# Patient Record
Sex: Female | Born: 1985 | Race: White | Hispanic: No | Marital: Married | Smoking: Never smoker
Health system: Southern US, Community
[De-identification: ages and names within clinical notes are randomized; demographics above are authoritative.]

## PROBLEM LIST (undated history)

## (undated) ENCOUNTER — Encounter

## (undated) ENCOUNTER — Encounter: Payer: BLUE CROSS/BLUE SHIELD | Attending: Colon & Rectal Surgery | Primary: Colon & Rectal Surgery

## (undated) ENCOUNTER — Telehealth

## (undated) ENCOUNTER — Encounter: Payer: BLUE CROSS/BLUE SHIELD | Attending: Surgery | Primary: Surgery

## (undated) ENCOUNTER — Encounter
Attending: Student in an Organized Health Care Education/Training Program | Primary: Student in an Organized Health Care Education/Training Program

## (undated) ENCOUNTER — Ambulatory Visit

## (undated) ENCOUNTER — Encounter: Payer: BLUE CROSS/BLUE SHIELD | Attending: Gastroenterology | Primary: Gastroenterology

## (undated) DIAGNOSIS — N809 Endometriosis, unspecified: Secondary | ICD-10-CM

## (undated) DIAGNOSIS — Z789 Other specified health status: Secondary | ICD-10-CM

## (undated) DIAGNOSIS — B019 Varicella without complication: Secondary | ICD-10-CM

## (undated) DIAGNOSIS — N764 Abscess of vulva: Secondary | ICD-10-CM

## (undated) DIAGNOSIS — L732 Hidradenitis suppurativa: Secondary | ICD-10-CM

## (undated) DIAGNOSIS — F331 Major depressive disorder, recurrent, moderate: Secondary | ICD-10-CM

## (undated) DIAGNOSIS — G4720 Circadian rhythm sleep disorder, unspecified type: Secondary | ICD-10-CM

## (undated) DIAGNOSIS — Z87442 Personal history of urinary calculi: Secondary | ICD-10-CM

## (undated) DIAGNOSIS — L910 Hypertrophic scar: Secondary | ICD-10-CM

## (undated) HISTORY — DX: Hypertrophic scar: L91.0

## (undated) HISTORY — DX: Varicella without complication: B01.9

## (undated) HISTORY — PX: DILATION AND CURETTAGE OF UTERUS: SHX78

## (undated) HISTORY — DX: Circadian rhythm sleep disorder, unspecified type: G47.20

## (undated) HISTORY — DX: Hidradenitis suppurativa: L73.2

## (undated) HISTORY — DX: Major depressive disorder, recurrent, moderate: F33.1

## (undated) HISTORY — DX: Endometriosis, unspecified: N80.9

## (undated) HISTORY — PX: TONSILLECTOMY: SUR1361

## (undated) HISTORY — PX: DIAGNOSTIC LAPAROSCOPY: SUR761

## (undated) HISTORY — DX: Abscess of vulva: N76.4

---
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
## 2011-10-10 DIAGNOSIS — N809 Endometriosis, unspecified: Secondary | ICD-10-CM

## 2011-10-10 HISTORY — DX: Endometriosis, unspecified: N80.9

## 2014-10-09 NOTE — L&D Delivery Note (Signed)
Delivery Note At 7:23 PM a viable female was delivered via Vaginal, Spontaneous Delivery (Presentation: LOT  ).  APGAR: 9, 0; weight 7 lb 5.5 oz (3330 g).   Placenta status: Intact, Spontaneous.     Anesthesia: Epidural  Episiotomy: None Lacerations: Periurethral;2nd degree Suture Repair: 2.0 3.0 vicryl Est. Blood Loss (mL):  100cc  Mom to postpartum.  Baby to Couplet care / Skin to Skin.  Tracie SimasJohanna K Johan [name] 07/30/2015, 10:00 PM

## 2015-02-15 LAB — OB RESULTS CONSOLE RPR: RPR: NONREACTIVE

## 2015-02-15 LAB — OB RESULTS CONSOLE ABO/RH: RH TYPE: POSITIVE

## 2015-02-15 LAB — OB RESULTS CONSOLE ANTIBODY SCREEN: Antibody Screen: NEGATIVE

## 2015-02-15 LAB — OB RESULTS CONSOLE GC/CHLAMYDIA
CHLAMYDIA, DNA PROBE: NEGATIVE
Gonorrhea: NEGATIVE

## 2015-02-15 LAB — OB RESULTS CONSOLE HEPATITIS B SURFACE ANTIGEN: Hepatitis B Surface Ag: NEGATIVE

## 2015-02-15 LAB — OB RESULTS CONSOLE RUBELLA ANTIBODY, IGM: Rubella: IMMUNE

## 2015-02-15 LAB — OB RESULTS CONSOLE HIV ANTIBODY (ROUTINE TESTING): HIV: NONREACTIVE

## 2015-06-15 DIAGNOSIS — Z3493 Encounter for supervision of normal pregnancy, unspecified, third trimester: Secondary | ICD-10-CM | POA: Insufficient documentation

## 2015-06-15 HISTORY — DX: Encounter for supervision of normal pregnancy, unspecified, third trimester: Z34.93

## 2015-07-12 DIAGNOSIS — Z6836 Body mass index (BMI) 36.0-36.9, adult: Secondary | ICD-10-CM | POA: Insufficient documentation

## 2015-07-12 DIAGNOSIS — E6609 Other obesity due to excess calories: Secondary | ICD-10-CM | POA: Insufficient documentation

## 2015-07-12 DIAGNOSIS — O99213 Obesity complicating pregnancy, third trimester: Secondary | ICD-10-CM | POA: Insufficient documentation

## 2015-07-12 HISTORY — DX: Obesity complicating pregnancy, third trimester: O99.213

## 2015-07-12 LAB — OB RESULTS CONSOLE GC/CHLAMYDIA
Chlamydia: NEGATIVE
GC PROBE AMP, GENITAL: NEGATIVE

## 2015-07-12 LAB — OB RESULTS CONSOLE GBS: GBS: NEGATIVE

## 2015-07-30 ENCOUNTER — Inpatient Hospital Stay: Admitting: Anesthesiology

## 2015-07-30 ENCOUNTER — Inpatient Hospital Stay
Admission: EM | Admit: 2015-07-30 | Discharge: 2015-08-01 | DRG: 775 | Disposition: A | Discharge status: Home / Self Care | Attending: Obstetrics and Gynecology | Admitting: Obstetrics and Gynecology

## 2015-07-30 DIAGNOSIS — Z3A38 38 weeks gestation of pregnancy: Secondary | ICD-10-CM

## 2015-07-30 LAB — ABO/RH: ABO/RH(D): A POS

## 2015-07-30 LAB — CBC
HCT: 40 % (ref 35.0–47.0)
HEMOGLOBIN: 13.6 g/dL (ref 12.0–16.0)
MCH: 29.9 pg (ref 26.0–34.0)
MCHC: 33.9 g/dL (ref 32.0–36.0)
MCV: 88.2 fL (ref 80.0–100.0)
Platelets: 162 10*3/uL (ref 150–440)
RBC: 4.54 MIL/uL (ref 3.80–5.20)
RDW: 14.2 % (ref 11.5–14.5)
WBC: 11.3 10*3/uL — ABNORMAL HIGH (ref 3.6–11.0)

## 2015-07-30 LAB — TYPE AND SCREEN
ABO/RH(D): A POS
Antibody Screen: NEGATIVE

## 2015-07-30 MED ORDER — LIDOCAINE HCL (PF) 1 % IJ SOLN: 30.0000 mL | INTRAMUSCULAR | Status: DC | PRN

## 2015-07-30 MED ORDER — LIDOCAINE-EPINEPHRINE (PF) 1.5 %-1:200000 IJ SOLN
INTRAMUSCULAR | Status: DC | PRN
  Administered 2015-07-30: 3 mL via EPIDURAL

## 2015-07-30 MED ORDER — CITRIC ACID-SODIUM CITRATE 334-500 MG/5ML PO SOLN
30.0000 mL | ORAL | Status: DC | PRN
Start: 2015-07-30 — End: 2015-07-30

## 2015-07-30 MED ORDER — BUPIVACAINE HCL (PF) 0.25 % IJ SOLN
INTRAMUSCULAR | Status: DC | PRN
  Administered 2015-07-30 (×2): 4 mL via EPIDURAL

## 2015-07-30 MED ORDER — BENZOCAINE-MENTHOL 20-0.5 % EX AERO
1.0000 "application " | INHALATION_SPRAY | CUTANEOUS | Status: DC | PRN
  Administered 2015-07-30: 1 via TOPICAL
  Filled 2015-07-30 (×3): qty 56

## 2015-07-30 MED ORDER — FENTANYL 2.5 MCG/ML W/ROPIVACAINE 0.2% IN NS 100 ML EPIDURAL INFUSION (ARMC-ANES)
EPIDURAL | Status: AC
  Filled 2015-07-30: qty 100

## 2015-07-30 MED ORDER — PRENATAL MULTIVITAMIN CH
1.0000 | ORAL_TABLET | Freq: Every day | ORAL | Status: DC
  Administered 2015-07-31 – 2015-08-01 (×2): 1 via ORAL
  Filled 2015-07-30 (×2): qty 1

## 2015-07-30 MED ORDER — OXYTOCIN BOLUS FROM INFUSION: 500.0000 mL | INTRAVENOUS | Status: DC

## 2015-07-30 MED ORDER — ONDANSETRON HCL 4 MG PO TABS
4.0000 mg | ORAL_TABLET | ORAL | Status: DC | PRN
  Administered 2015-07-31 – 2015-08-01 (×6): 4 mg via ORAL
  Filled 2015-07-30 (×6): qty 1

## 2015-07-30 MED ORDER — IBUPROFEN 600 MG PO TABS
600.0000 mg | ORAL_TABLET | Freq: Four times a day (QID) | ORAL | Status: DC
  Administered 2015-07-30 – 2015-08-01 (×8): 600 mg via ORAL
  Filled 2015-07-30 (×8): qty 1

## 2015-07-30 MED ORDER — SODIUM CHLORIDE 0.9 % IV SOLN: 250.0000 mL | INTRAVENOUS | Status: DC | PRN

## 2015-07-30 MED ORDER — LIDOCAINE HCL (PF) 1 % IJ SOLN
INTRAMUSCULAR | Status: AC
  Filled 2015-07-30: qty 30

## 2015-07-30 MED ORDER — MISOPROSTOL 200 MCG PO TABS
ORAL_TABLET | ORAL | Status: AC
  Filled 2015-07-30: qty 4

## 2015-07-30 MED ORDER — ONDANSETRON HCL 4 MG/2ML IJ SOLN: 4.0000 mg | Freq: Four times a day (QID) | INTRAMUSCULAR | Status: DC | PRN

## 2015-07-30 MED ORDER — OXYTOCIN 10 UNIT/ML IJ SOLN
INTRAMUSCULAR | Status: AC
  Filled 2015-07-30: qty 2

## 2015-07-30 MED ORDER — OXYCODONE-ACETAMINOPHEN 5-325 MG PO TABS
2.0000 | ORAL_TABLET | ORAL | Status: DC | PRN
  Administered 2015-07-30 – 2015-08-01 (×10): 2 via ORAL
  Filled 2015-07-30 (×10): qty 2

## 2015-07-30 MED ORDER — DIPHENHYDRAMINE HCL 25 MG PO CAPS: 25.0000 mg | ORAL_CAPSULE | Freq: Four times a day (QID) | ORAL | Status: DC | PRN

## 2015-07-30 MED ORDER — SODIUM CHLORIDE 0.9 % IJ SOLN: 3.0000 mL | INTRAMUSCULAR | Status: DC | PRN

## 2015-07-30 MED ORDER — LANOLIN HYDROUS EX OINT: TOPICAL_OINTMENT | CUTANEOUS | Status: DC | PRN

## 2015-07-30 MED ORDER — OXYCODONE-ACETAMINOPHEN 5-325 MG PO TABS: 1.0000 | ORAL_TABLET | ORAL | Status: DC | PRN

## 2015-07-30 MED ORDER — ONDANSETRON HCL 4 MG/2ML IJ SOLN
4.0000 mg | INTRAMUSCULAR | Status: DC | PRN
  Administered 2015-07-30 – 2015-07-31 (×3): 4 mg via INTRAVENOUS
  Filled 2015-07-30 (×3): qty 2

## 2015-07-30 MED ORDER — AMMONIA AROMATIC IN INHA
RESPIRATORY_TRACT | Status: AC
  Filled 2015-07-30: qty 10

## 2015-07-30 MED ORDER — LACTATED RINGERS IV SOLN
INTRAVENOUS | Status: DC
Start: 2015-07-30 — End: 2015-07-30
  Administered 2015-07-30 (×2): via INTRAVENOUS

## 2015-07-30 MED ORDER — SODIUM CHLORIDE 0.9 % IJ SOLN: 3.0000 mL | Freq: Two times a day (BID) | INTRAMUSCULAR | Status: DC

## 2015-07-30 MED ORDER — BISACODYL 10 MG RE SUPP: 10.0000 mg | Freq: Every day | RECTAL | Status: DC | PRN

## 2015-07-30 MED ORDER — TETANUS-DIPHTH-ACELL PERTUSSIS 5-2.5-18.5 LF-MCG/0.5 IM SUSP: 0.5000 mL | Freq: Once | INTRAMUSCULAR | Status: DC

## 2015-07-30 MED ORDER — BUTORPHANOL TARTRATE 1 MG/ML IJ SOLN
INTRAMUSCULAR | Status: AC
  Administered 2015-07-30: 2 mg via INTRAVENOUS
  Filled 2015-07-30: qty 2

## 2015-07-30 MED ORDER — OXYTOCIN 40 UNITS IN LACTATED RINGERS INFUSION - SIMPLE MED: 62.5000 mL/h | INTRAVENOUS | Status: DC

## 2015-07-30 MED ORDER — FLEET ENEMA 7-19 GM/118ML RE ENEM: 1.0000 | ENEMA | Freq: Every day | RECTAL | Status: DC | PRN

## 2015-07-30 MED ORDER — WITCH HAZEL-GLYCERIN EX PADS
1.0000 "application " | MEDICATED_PAD | CUTANEOUS | Status: DC | PRN
  Administered 2015-07-30: 1 via TOPICAL
  Filled 2015-07-30: qty 100

## 2015-07-30 MED ORDER — ACETAMINOPHEN 325 MG PO TABS: 650.0000 mg | ORAL_TABLET | ORAL | Status: DC | PRN

## 2015-07-30 MED ORDER — LACTATED RINGERS IV SOLN: 500.0000 mL | INTRAVENOUS | Status: DC | PRN

## 2015-07-30 MED ORDER — DIBUCAINE 1 % RE OINT
1.0000 "application " | TOPICAL_OINTMENT | RECTAL | Status: DC | PRN
  Administered 2015-07-30: 1 via RECTAL
  Filled 2015-07-30: qty 28

## 2015-07-30 MED ORDER — BUTORPHANOL TARTRATE 1 MG/ML IJ SOLN
2.0000 mg | Freq: Once | INTRAMUSCULAR | Status: AC
  Administered 2015-07-30: 2 mg via INTRAVENOUS

## 2015-07-30 MED ORDER — SENNOSIDES-DOCUSATE SODIUM 8.6-50 MG PO TABS
2.0000 | ORAL_TABLET | ORAL | Status: DC
  Filled 2015-07-30: qty 2

## 2015-07-30 MED ORDER — FENTANYL 2.5 MCG/ML W/ROPIVACAINE 0.2% IN NS 100 ML EPIDURAL INFUSION (ARMC-ANES)
EPIDURAL | Status: DC | PRN
  Administered 2015-07-30: 10 mL/h via EPIDURAL

## 2015-07-30 MED ORDER — ZOLPIDEM TARTRATE 5 MG PO TABS: 5.0000 mg | ORAL_TABLET | Freq: Every evening | ORAL | Status: DC | PRN

## 2015-07-30 MED ORDER — OXYCODONE-ACETAMINOPHEN 5-325 MG PO TABS: 2.0000 | ORAL_TABLET | ORAL | Status: DC | PRN

## 2015-07-30 MED ORDER — LACTATED RINGERS IV SOLN: INTRAVENOUS | Status: DC

## 2015-07-30 MED ORDER — OXYTOCIN 40 UNITS IN LACTATED RINGERS INFUSION - SIMPLE MED
62.5000 mL/h | INTRAVENOUS | Status: DC | PRN
  Administered 2015-07-31: 62.5 mL/h via INTRAVENOUS
  Filled 2015-07-30: qty 1000

## 2015-07-30 MED ORDER — SIMETHICONE 80 MG PO CHEW: 80.0000 mg | CHEWABLE_TABLET | ORAL | Status: DC | PRN

## 2015-07-30 NOTE — Anesthesia Preprocedure Evaluation (Signed)
Anesthesia Evaluation  Patient identified by MRN, date of birth, ID band Patient awake    Reviewed: Allergy & Precautions, H&P , NPO status , Patient's Chart, lab work & pertinent test results  History of Anesthesia Complications Negative for: history of anesthetic complications  Airway Mallampati: II       Dental no notable dental hx.    Pulmonary    Pulmonary exam normal        Cardiovascular Normal cardiovascular exam     Neuro/Psych    GI/Hepatic   Endo/Other    Renal/GU      Musculoskeletal   Abdominal   Peds  Hematology   Anesthesia Other Findings   Reproductive/Obstetrics (+) Pregnancy                             Anesthesia Physical Anesthesia Plan  ASA: II  Anesthesia Plan: Epidural   Post-op Pain Management:    Induction:   Airway Management Planned:   Additional Equipment:   Intra-op Plan:   Post-operative Plan:   Informed Consent: I have reviewed the patients History and Physical, chart, labs and discussed the procedure including the risks, benefits and alternatives for the proposed anesthesia with the patient or authorized representative who has indicated his/her understanding and acceptance.     Plan Discussed with: Anesthesiologist  Anesthesia Plan Comments:         Anesthesia Quick Evaluation

## 2015-07-30 NOTE — H&P (Signed)
OB ATTENDING NOTE  LMP of 11/05/14 EDD: 08/12/15  Subjective:   29 y.o. G3P1011 at 38+1 here in labor.  APC: [place]   MBT  A+  Ab screen  Neg Pap  HIV  Hep B/RPR negative/negative  Rubella immune   VZV   Aneuploidy:   First trimester (Informaseq, NT):   Second trimester (AFP/tetra):   28 weeks:   Blood consent:  Hgb:  11.7  Glucola: 143, 3 hr GTT was normal (72,536,644,03(85,143,118,97)  Rhogam: not needed  36 weeks:   GBS: Negative   G/C: Neg/Neg  Hgb: 12.7  Last US:   03/22/15-post plac, vertex, normal anatomy  Immunization:    Flu in season - 07/12/15  Tdap at 27-36 weeks - 07/12/15  Blood consent signed  Previous NSVD 04/08/2012 2.75kg MC  Objective:   Vital signs in last 24 hours: Temp:  [98.8 F (37.1 C)] 98.8 F (37.1 C) (10/21 1034) Pulse Rate:  [78-126] 78 (10/21 1726) BP: (97-137)/(53-108) 116/65 mmHg (10/21 1726) SpO2:  [94 %] 94 % (10/21 1706) Weight:  [94.802 kg (209 lb)] 94.802 kg (209 lb) (10/21 1034)   General:   alert, cooperative and appears stated age  Abdomen:  soft, non-tender; bowel sounds normal; no masses,  no organomegaly  Pelvis:  Exam deferred. Vulva normal   Started at 1/long/high this morning, quickly transitioned to 3cm then 8cm SVE: 8/80/-2, AROM'd clear fluid    Assessment/Plan:  .   29 y.o. G3P1011 at 38+1 here in active labor.   Risks, benefits, alternatives and possible complications have been discussed in detail with the patient.  Pre-admission, admission, and post admission procedures and expectations were discussed in detail.  All questions answered, all appropriate consents will be signed at the Hospital. Admission is planned for today.  Anticipate vaginal delivery.    Anticipate NSVD  Ala DachJohanna K Samyrah Bruster, MD

## 2015-07-30 NOTE — OB Triage Note (Signed)
Pt place on LDR5 for obs for contractions. Oriented to room, POC discussed and verbalized understanding of plan.

## 2015-07-30 NOTE — Anesthesia Procedure Notes (Signed)
Epidural  Start time: 07/30/2015 4:40 PM End time: 07/30/2015 4:53 PM  Staffing Resident/CRNA: Stormy FabianURTIS, LINDA Performed by: resident/CRNA   Preanesthetic Checklist Completed: patient identified, site marked, surgical consent, pre-op evaluation, IV checked, risks and benefits discussed and monitors and equipment checked  Epidural Patient position: sitting Prep: Betadine Patient monitoring: heart rate, continuous pulse ox and blood pressure Approach: midline Location: L3-L4 Injection technique: LOR saline  Needle:  Needle type: Tuohy  Needle gauge: 17 G Needle length: 9 cm Needle insertion depth: 5.5 cm Catheter type: closed end flexible Catheter size: 20 Guage Catheter at skin depth: 11 cm Test dose: negative and 1.5% lidocaine with Epi 1:200 K  Assessment Events: blood not aspirated, injection not painful, no injection resistance, negative IV test and no paresthesia  Additional Notes Reason for block:procedure for pain

## 2015-07-31 LAB — CBC
HCT: 34.9 % — ABNORMAL LOW (ref 35.0–47.0)
HEMOGLOBIN: 11.9 g/dL — AB (ref 12.0–16.0)
MCH: 30.3 pg (ref 26.0–34.0)
MCHC: 34.2 g/dL (ref 32.0–36.0)
MCV: 88.6 fL (ref 80.0–100.0)
Platelets: 151 10*3/uL (ref 150–440)
RBC: 3.94 MIL/uL (ref 3.80–5.20)
RDW: 14.5 % (ref 11.5–14.5)
WBC: 11.6 10*3/uL — AB (ref 3.6–11.0)

## 2015-07-31 LAB — RPR: RPR Ser Ql: NONREACTIVE

## 2015-07-31 NOTE — Progress Notes (Signed)
OB ATTENDING NOTE  PPD#1  29 y.o. [id number] s/p NSVD @ 38+1 c/b R periurethral and 2nd degree tear. Repaired with 3-0 and 2-0 vicryl. Doing well. Bleeding controlled. Pain controlled with percocet. Ambulating to bathroom. Passing gas. Eating well. Hydrating. No ha/blurry vision. Breast feeding without difficulty  O: Filed Vitals:   07/30/15 2155 07/31/15 0631 07/31/15 0822 07/31/15 1204  BP: 123/57 113/63 110/62 113/69  Pulse: 112 82 80 74  Temp: 98.3 F (36.8 C) 98.1 F (36.7 C) 98.1 F (36.7 C) 98 F (36.7 C)  TempSrc: Oral Axillary Axillary Oral  Resp: 20 18 18 18   Height:      Weight:      SpO2: 96% 97% 96%     GEN: NAD ABD: fundus firm below umbilicus VULVA: healing well  CBC Latest Ref Rng 07/31/2015 07/30/2015  WBC 3.6 - 11.0 K/uL 11.6(H) 11.3(H)  Hemoglobin 12.0 - 16.0 g/dL 11.9(L) 13.6  Hematocrit 35.0 - 47.0 % 34.9(L) 40.0  Platelets 150 - 440 K/uL 151 162   A/P: PPD#1 Pain control with percocet and motrin Colace Cont to ambulate Ok to remove IV Stable h/h Stable vitals  Still considering contraception Await VZV results  Will f/u tomorrow Ala DachJohanna K Lynia Landry, MD

## 2015-07-31 NOTE — Progress Notes (Signed)
Called to evaluate patient as lochia remained moderate per RN. Perineum checked. Minimal swelling. With gentle pressure some dark red blood expressed. Not active. No bright red blood. Incision site intact and not bleeding.   Will cont to monitor. F/u cbc in AM.

## 2015-08-01 LAB — VARICELLA ZOSTER ANTIBODY, IGG: VARICELLA IGG: 1034 {index} (ref >165)

## 2015-08-01 MED ORDER — PRENATAL MULTIVITAMIN CH: 1.0000 | ORAL_TABLET | Freq: Every day | ORAL | Status: DC

## 2015-08-01 MED ORDER — ACETAMINOPHEN-CODEINE #3 300-30 MG PO TABS: 2.0000 | ORAL_TABLET | Freq: Four times a day (QID) | ORAL | Status: DC | PRN

## 2015-08-01 MED ORDER — WHITE PETROLATUM GEL
Status: AC
  Filled 2015-08-01: qty 5

## 2015-08-01 NOTE — Anesthesia Postprocedure Evaluation (Signed)
  Anesthesia Post-op Note  Patient: Tracie [name]  Procedure(s) Performed: * No procedures listed *  Anesthesia type:Epidural  Patient location: PACU  Post pain: Pain level controlled  Post assessment: Post-op Vital signs reviewed, Patient's Cardiovascular Status Stable, Respiratory Function Stable, Patent Airway and No signs of Nausea or vomiting  Post vital signs: Reviewed and stable  Last Vitals:  Filed Vitals:   08/01/15 1614  BP:   Pulse:   Temp: 36.9 C  Resp:     Level of consciousness: awake, alert  and patient cooperative  Complications: No apparent anesthesia complications

## 2015-08-01 NOTE — Progress Notes (Signed)
Discharge instructions reviewed with patient. Patient discharged home.

## 2015-08-01 NOTE — Discharge Instructions (Signed)

## 2015-08-01 NOTE — Discharge Summary (Addendum)
Obstetric Discharge Summary Reason for Admission: onset of labor Prenatal Procedures: none Intrapartum Procedures: spontaneous vaginal delivery Postpartum Procedures: none Complications-Operative and Postpartum: 2nd degree perineal laceration repaired with 2-0 vicryl , right periurethral repaired with 3-0 HEMOGLOBIN  Date Value Ref Range Status  07/31/2015 11.9* 12.0 - 16.0 g/dL Final   HCT  Date Value Ref Range Status  07/31/2015 34.9* 35.0 - 47.0 % Final    Physical Exam:  General: alert, cooperative, appears stated age and no distress Lochia: appropriate Uterine Fundus: firm Incision: healing well DVT Evaluation: No evidence of DVT seen on physical exam.  Discharge Diagnoses: Term Pregnancy-delivered  Discharge Information: Date: 08/01/2015 Activity: pelvic rest Diet: routine Medications: PNV, Tylenol #3, Ibuprofen and Colace Condition: stable Instructions: refer to practice specific booklet Discharge to: home   Contraception: Mirena IUD *[facility]  Newborn Data: Live born female  Birth Weight: 7 lb 5.5 oz (3330 g) APGAR: 9,   Home with mother.  Tracie [name] 08/01/2015, 9:32 AM

## 2015-09-08 DIAGNOSIS — L732 Hidradenitis suppurativa: Secondary | ICD-10-CM

## 2015-09-08 HISTORY — DX: Hidradenitis suppurativa: L73.2

## 2015-12-29 DIAGNOSIS — N764 Abscess of vulva: Secondary | ICD-10-CM | POA: Insufficient documentation

## 2015-12-29 HISTORY — DX: Abscess of vulva: N76.4

## 2016-01-24 ENCOUNTER — Encounter
Admission: RE | Admit: 2016-01-24 | Discharge: 2016-01-24 | Disposition: A | Discharge status: Home / Self Care | Payer: 59 | Source: Ambulatory Visit | Attending: Obstetrics and Gynecology | Admitting: Obstetrics and Gynecology

## 2016-01-24 DIAGNOSIS — L732 Hidradenitis suppurativa: Secondary | ICD-10-CM | POA: Diagnosis not present

## 2016-01-24 DIAGNOSIS — Z01812 Encounter for preprocedural laboratory examination: Secondary | ICD-10-CM | POA: Diagnosis present

## 2016-01-24 HISTORY — DX: Other specified health status: Z78.9

## 2016-01-24 LAB — BASIC METABOLIC PANEL
ANION GAP: 4 — AB (ref 5–15)
BUN: 11 mg/dL (ref 6–20)
CHLORIDE: 105 mmol/L (ref 101–111)
CO2: 27 mmol/L (ref 22–32)
Calcium: 9.2 mg/dL (ref 8.9–10.3)
Creatinine, Ser: 0.59 mg/dL (ref 0.44–1.00)
GFR calc Af Amer: >60 mL/min (ref >60)
GLUCOSE: 87 mg/dL (ref 65–99)
POTASSIUM: 3.8 mmol/L (ref 3.5–5.1)
Sodium: 136 mmol/L (ref 135–145)

## 2016-01-24 LAB — CBC
HEMATOCRIT: 40 % (ref 35.0–47.0)
HEMOGLOBIN: 13.6 g/dL (ref 12.0–16.0)
MCH: 29.1 pg (ref 26.0–34.0)
MCHC: 34 g/dL (ref 32.0–36.0)
MCV: 85.8 fL (ref 80.0–100.0)
Platelets: 236 10*3/uL (ref 150–440)
RBC: 4.67 MIL/uL (ref 3.80–5.20)
RDW: 13 % (ref 11.5–14.5)
WBC: 5.9 10*3/uL (ref 3.6–11.0)

## 2016-01-24 LAB — TYPE AND SCREEN
ABO/RH(D): A POS
ANTIBODY SCREEN: NEGATIVE

## 2016-01-24 NOTE — Pre-Procedure Instructions (Signed)
Patient instructed on Incentive Spirometry use. Patient verbalized understanding

## 2016-01-24 NOTE — H&P (Signed)
Tracie [name] is a 30 y.o. female here for excisional biopsy for hidradenitis suppurativa. Returns for f/up for chronic recurrent left vulvar abscess formation . Pt on BActrim ds with healing . This has been a recurrent issue for 2 yrs.   Past Medical History:  has a past medical history of Endometriosis of uterus.  Past Surgical History:  has a past surgical history that includes Pelvic laparoscopy (2010) and Dilation and curettage of uterus (2011). Family History: family history includes Breast cancer in her maternal grandmother; Colon cancer in her paternal grandfather. Social History:  reports that she has never smoked. She does not have any smokeless tobacco history on file. She reports that she does not drink alcohol or use illicit drugs. OB/GYN History:  OB History    Gravida Para Term Preterm AB TAB SAB Ectopic Multiple Living   3 2 2  1  1   2       Allergies: is allergic to norco [hydrocodone-acetaminophen]. Medications:  Current Outpatient Prescriptions:  . cephalexin (KEFLEX) 500 MG capsule, Take 1 capsule (500 mg total) by mouth 2 (two) times daily. (Patient not taking: Reported on 12/20/2015 ), Disp: 14 capsule, Rfl: 0 . clindamycin (CLEOCIN T) 1 % topical solution, Apply topically 2 (two) times daily. Apply to affected twice a day to affected area for 3 months (Patient not taking: Reported on 12/20/2015 ), Disp: 60 mL, Rfl: 2 . ondansetron (ZOFRAN) 4 MG tablet, Take 1 tablet (4 mg total) by mouth every 8 (eight) hours as needed for Nausea., Disp: 20 tablet, Rfl: 0 . oxyCODONE-acetaminophen (PERCOCET) 5-325 mg tablet, Take 1 tablet by mouth every 6 (six) hours as needed for Pain., Disp: 30 tablet, Rfl: 0 . prenatal vit-iron fumarate-FA (PRENAVITE) tablet, Take 1 tablet by mouth once daily. Reported on 12/20/2015 , Disp: , Rfl:  . sulfamethoxazole-trimethoprim (BACTRIM DS) 800-160 mg tablet, Take 1 tablet (160 mg of trimethoprim total) by mouth 2 (two) times daily., Disp: 20  tablet, Rfl: 0  Review of Systems: General:   No fatigue or weight loss Eyes:   No vision changes Ears:   No hearing difficulty Respiratory:   No cough or shortness of breath Pulmonary:   No asthma or shortness of breath Cardiovascular:  No chest pain, palpitations, dyspnea on exertion Gastrointestinal:  No abdominal bloating, chronic diarrhea, constipations, masses, pain or hematochezia Genitourinary:  No hematuria, dysuria, abnormal vaginal discharge, pelvic pain, Menometrorrhagia Lymphatic:  No swollen lymph nodes Musculoskeletal: No muscle weakness Neurologic:  No extremity weakness, syncope, seizure disorder Psychiatric:  No history of depression, delusions or suicidal/homicidal ideation   Exam:      Vitals:   12/29/15 1610  BP: 121/72  Pulse: 88    Body mass index is 35.07 kg/(m^2).  WDWN Wisener/ black female in NAD  Lungs: CTA  CV : RRR without murmur  Breast: exam done in sitting and lying position : No dimpling or retraction, no dominant mass, no spontaneous discharge, no axillary adenopathy Neck: no thyromegaly Abdomen: soft , no mass, normal active bowel sounds, non-tender, no rebound tenderness Pelvic: tanner stage 5 ,  External genitalia: left vulvar induration . No fluctuance Urethra: no prolapse   Impression:   The primary encounter diagnosis was Vulval hidradenitis suppurativa. A diagnosis of Vulvar abscess was also pertinent to this visit. Not responding to conservative measures    Plan:   Recommend vulvar excisional biopsy Procedure discuss with the pt     Return if symptoms worsen or fail to improve, for preop.  Vilma Prader, MD       Electronically signed by Vilma Prader, MD at 12/29/2015 4:59 PM      Office Visit on 12/29/2015      Department  Name Address Phone Fax  [facility] [address] [place] [address] [phone]  Service Location  Name  Address      [facility] MEDICINE SERVICE AREA [address] [place] [address]

## 2016-01-24 NOTE — Patient Instructions (Addendum)
Your procedure is scheduled on: Monday 01/31/16 Report to Day Surgery. 2ND FLOOR MEDICAL MALL ENTRANCE To find out your arrival time please call [phone] between 1PM - 3PM on Friday 01/28/16.  Remember: Instructions that are not followed completely may result in serious medical risk, up to and including death, or upon the discretion of your surgeon and anesthesiologist your surgery may need to be rescheduled.    __X__ 1. Do not eat food or drink liquids after midnight. No gum chewing or hard candies.     __X__ 2. No Alcohol for 24 hours before or after surgery.   ____ 3. Bring all medications with you on the day of surgery if instructed.    __X__ 4. Notify your doctor if there is any change in your medical condition     (cold, fever, infections).     Do not wear jewelry, make-up, hairpins, clips or nail polish.  Do not wear lotions, powders, or perfumes.   Do not shave 48 hours prior to surgery. Men may shave face and neck.  Do not bring valuables to the hospital.    [facility] any belongings or valuables.               Contacts, dentures or bridgework may not be worn into surgery.  Leave your suitcase in the car. After surgery it may be brought to your room.  For patients admitted to the hospital, discharge time is determined by your                treatment team.   Patients discharged the day of surgery will not be allowed to drive home.   Please read over the following fact sheets that you were given:   Surgical Site Infection Prevention   ____ Take these medicines the morning of surgery with A SIP OF WATER:    1. NONE  2. BRING INCENTIVE SPIROMETER  3.   4.  5.  6.  ____ Fleet Enema (as directed)   __X__ Use CHG Soap as directed  ____ Use inhalers on the day of surgery  ____ Stop metformin 2 days prior to surgery    ____ Take 1/2 of usual insulin dose the night before surgery and none on the morning of surgery.   ____ Stop  Coumadin/Plavix/aspirin on   ____ Stop Anti-inflammatories on    ____ Stop supplements until after surgery.    ____ Bring C-Pap to the hospital.

## 2016-01-31 ENCOUNTER — Ambulatory Visit: Payer: 59 | Admitting: Certified Registered Nurse Anesthetist

## 2016-01-31 ENCOUNTER — Encounter
Admission: RE | Disposition: A | Discharge status: Home / Self Care | Payer: Self-pay | Source: Ambulatory Visit | Attending: Obstetrics and Gynecology

## 2016-01-31 ENCOUNTER — Encounter: Payer: Self-pay | Admitting: *Deleted

## 2016-01-31 ENCOUNTER — Ambulatory Visit
Admission: RE | Admit: 2016-01-31 | Discharge: 2016-01-31 | Disposition: A | Discharge status: Home / Self Care | Payer: 59 | Source: Ambulatory Visit | Attending: Obstetrics and Gynecology | Admitting: Obstetrics and Gynecology

## 2016-01-31 DIAGNOSIS — Z885 Allergy status to narcotic agent status: Secondary | ICD-10-CM | POA: Insufficient documentation

## 2016-01-31 DIAGNOSIS — Z8742 Personal history of other diseases of the female genital tract: Secondary | ICD-10-CM | POA: Insufficient documentation

## 2016-01-31 DIAGNOSIS — Z79899 Other long term (current) drug therapy: Secondary | ICD-10-CM | POA: Diagnosis not present

## 2016-01-31 DIAGNOSIS — L732 Hidradenitis suppurativa: Secondary | ICD-10-CM | POA: Diagnosis present

## 2016-01-31 DIAGNOSIS — N764 Abscess of vulva: Secondary | ICD-10-CM | POA: Insufficient documentation

## 2016-01-31 HISTORY — PX: VULVA /PERINEUM BIOPSY: SHX319

## 2016-01-31 LAB — POCT PREGNANCY, URINE: PREG TEST UR: NEGATIVE

## 2016-01-31 SURGERY — BIOPSY, VULVA
Anesthesia: General | Laterality: Left | Wound class: Dirty or Infected

## 2016-01-31 MED ORDER — SILVER NITRATE-POT NITRATE 75-25 % EX MISC
CUTANEOUS | Status: AC
  Filled 2016-01-31: qty 4

## 2016-01-31 MED ORDER — FAMOTIDINE 20 MG PO TABS
20.0000 mg | ORAL_TABLET | Freq: Once | ORAL | Status: AC
  Administered 2016-01-31: 20 mg via ORAL

## 2016-01-31 MED ORDER — CEFAZOLIN SODIUM 1-5 GM-% IV SOLN
1.0000 g | Freq: Three times a day (TID) | INTRAVENOUS | Status: DC
  Administered 2016-01-31: 1 g via INTRAVENOUS

## 2016-01-31 MED ORDER — ONDANSETRON HCL 4 MG/2ML IJ SOLN: 4.0000 mg | Freq: Once | INTRAMUSCULAR | Status: DC | PRN

## 2016-01-31 MED ORDER — BACITRACIN ZINC 500 UNIT/GM EX OINT
TOPICAL_OINTMENT | CUTANEOUS | Status: DC | PRN
  Administered 2016-01-31: 1 via TOPICAL

## 2016-01-31 MED ORDER — KETOROLAC TROMETHAMINE 30 MG/ML IJ SOLN
INTRAMUSCULAR | Status: DC | PRN
  Administered 2016-01-31: 30 mg via INTRAVENOUS

## 2016-01-31 MED ORDER — ACETAMINOPHEN 10 MG/ML IV SOLN
INTRAVENOUS | Status: DC | PRN
  Administered 2016-01-31: 1000 mg via INTRAVENOUS

## 2016-01-31 MED ORDER — DEXAMETHASONE SODIUM PHOSPHATE 10 MG/ML IJ SOLN
INTRAMUSCULAR | Status: DC | PRN
  Administered 2016-01-31: 10 mg via INTRAVENOUS

## 2016-01-31 MED ORDER — LIDOCAINE-EPINEPHRINE (PF) 1 %-1:200000 IJ SOLN
INTRAMUSCULAR | Status: AC
  Filled 2016-01-31: qty 30

## 2016-01-31 MED ORDER — BUPIVACAINE-EPINEPHRINE (PF) 0.5% -1:200000 IJ SOLN
INTRAMUSCULAR | Status: AC
  Filled 2016-01-31: qty 30

## 2016-01-31 MED ORDER — ACETAMINOPHEN 10 MG/ML IV SOLN
INTRAVENOUS | Status: AC
  Filled 2016-01-31: qty 100

## 2016-01-31 MED ORDER — FAMOTIDINE 20 MG PO TABS
ORAL_TABLET | ORAL | Status: AC
  Filled 2016-01-31: qty 1

## 2016-01-31 MED ORDER — BACITRACIN ZINC 500 UNIT/GM EX OINT
TOPICAL_OINTMENT | CUTANEOUS | Status: AC
  Filled 2016-01-31: qty 0.9

## 2016-01-31 MED ORDER — BUPIVACAINE-EPINEPHRINE (PF) 0.5% -1:200000 IJ SOLN
INTRAMUSCULAR | Status: DC | PRN
  Administered 2016-01-31: 15 mL

## 2016-01-31 MED ORDER — MIDAZOLAM HCL 2 MG/2ML IJ SOLN
INTRAMUSCULAR | Status: DC | PRN
  Administered 2016-01-31: 2 mg via INTRAVENOUS

## 2016-01-31 MED ORDER — CEFAZOLIN SODIUM 1-5 GM-% IV SOLN
INTRAVENOUS | Status: AC
  Filled 2016-01-31: qty 50

## 2016-01-31 MED ORDER — LIDOCAINE HCL (CARDIAC) 20 MG/ML IV SOLN
INTRAVENOUS | Status: DC | PRN
  Administered 2016-01-31: 100 mg via INTRAVENOUS

## 2016-01-31 MED ORDER — PROPOFOL 10 MG/ML IV BOLUS
INTRAVENOUS | Status: DC | PRN
  Administered 2016-01-31: 200 mg via INTRAVENOUS

## 2016-01-31 MED ORDER — LACTATED RINGERS IV SOLN: INTRAVENOUS | Status: DC

## 2016-01-31 MED ORDER — FENTANYL CITRATE (PF) 100 MCG/2ML IJ SOLN
INTRAMUSCULAR | Status: DC | PRN
  Administered 2016-01-31 (×4): 25 ug via INTRAVENOUS
  Administered 2016-01-31: 50 ug via INTRAVENOUS
  Administered 2016-01-31: 25 ug via INTRAVENOUS

## 2016-01-31 MED ORDER — LACTATED RINGERS IV SOLN
INTRAVENOUS | Status: DC
  Administered 2016-01-31: 11:00:00 via INTRAVENOUS

## 2016-01-31 MED ORDER — FENTANYL CITRATE (PF) 100 MCG/2ML IJ SOLN: 25.0000 ug | INTRAMUSCULAR | Status: DC | PRN

## 2016-01-31 MED ORDER — ONDANSETRON HCL 4 MG/2ML IJ SOLN
INTRAMUSCULAR | Status: DC | PRN
  Administered 2016-01-31: 4 mg via INTRAVENOUS

## 2016-01-31 SURGICAL SUPPLY — 32 items
BLADE SURG 15 STRL LF DISP TIS (BLADE) ×1 IMPLANT
BLADE SURG 15 STRL SS (BLADE) ×2
CANISTER SUCT 1200ML W/VALVE (MISCELLANEOUS) ×3 IMPLANT
CATH ROBINSON RED A/P 16FR (CATHETERS) ×3 IMPLANT
DRAPE PERI LITHO V/GYN (MISCELLANEOUS) ×3 IMPLANT
DRAPE UNDER BUTTOCK W/FLU (DRAPES) ×3 IMPLANT
DRSG TEGADERM 2-3/8X2-3/4 SM (GAUZE/BANDAGES/DRESSINGS) ×6 IMPLANT
DRSG TEGADERM 4X4.75 (GAUZE/BANDAGES/DRESSINGS) ×3 IMPLANT
DRSG TELFA 3X8 NADH (GAUZE/BANDAGES/DRESSINGS) ×3 IMPLANT
GAUZE SPONGE NON-WVN 2X2 STRL (MISCELLANEOUS) ×2 IMPLANT
GLOVE BIO SURGEON STRL SZ8 (GLOVE) ×9 IMPLANT
GOWN STRL REUS W/ TWL LRG LVL3 (GOWN DISPOSABLE) ×3 IMPLANT
GOWN STRL REUS W/ TWL XL LVL3 (GOWN DISPOSABLE) ×2 IMPLANT
GOWN STRL REUS W/TWL LRG LVL3 (GOWN DISPOSABLE) ×6
GOWN STRL REUS W/TWL XL LVL3 (GOWN DISPOSABLE) ×4
KIT RM TURNOVER CYSTO AR (KITS) ×3 IMPLANT
NDL SAFETY 22GX1.5 (NEEDLE) ×3 IMPLANT
NS IRRIG 500ML POUR BTL (IV SOLUTION) ×3 IMPLANT
PACK BASIN MINOR ARMC (MISCELLANEOUS) ×3 IMPLANT
PAD OB MATERNITY 4.3X12.25 (PERSONAL CARE ITEMS) ×3 IMPLANT
PAD PREP 24X41 OB/GYN DISP (PERSONAL CARE ITEMS) ×3 IMPLANT
SOL PREP PVP 2OZ (MISCELLANEOUS) ×3
SOLUTION PREP PVP 2OZ (MISCELLANEOUS) ×1 IMPLANT
SPONGE VERSALON 2X2 STRL (MISCELLANEOUS) ×4
SPONGE XRAY 4X4 16PLY STRL (MISCELLANEOUS) ×3 IMPLANT
SUT CHROMIC 2 0 SH (SUTURE) ×9 IMPLANT
SUT CHROMIC 3 0 SH 27 (SUTURE) ×3 IMPLANT
SUT VIC AB 2-0 SH 27 (SUTURE) ×6
SUT VIC AB 2-0 SH 27XBRD (SUTURE) ×3 IMPLANT
SUT VIC AB 4-0 SH 27 (SUTURE) ×4
SUT VIC AB 4-0 SH 27XANBCTRL (SUTURE) ×2 IMPLANT
TOWEL OR 17X26 4PK STRL BLUE (TOWEL DISPOSABLE) ×3 IMPLANT

## 2016-01-31 NOTE — Progress Notes (Signed)
Pt is ready for surgery . NPO . Neg HCG . All questions answered . Proceed with excisional biopsy/ies of the perineum

## 2016-01-31 NOTE — Anesthesia Procedure Notes (Signed)
Procedure Name: LMA Insertion Performed by: Ane Conerly Pre-anesthesia Checklist: Patient identified, Patient being monitored, Timeout performed, Emergency Drugs available and Suction available Patient Re-evaluated:Patient Re-evaluated prior to inductionOxygen Delivery Method: Circle system utilized Preoxygenation: Pre-oxygenation with 100% oxygen Intubation Type: IV induction Ventilation: Mask ventilation without difficulty LMA: LMA inserted LMA Size: 4.0 Tube type: Oral Number of attempts: 1 Placement Confirmation: positive ETCO2 and breath sounds checked- equal and bilateral Tube secured with: Tape Dental Injury: Teeth and Oropharynx as per pre-operative assessment        

## 2016-01-31 NOTE — Brief Op Note (Signed)
01/31/2016  12:27 PM  PATIENT:  Tracie [name]  30 y.o. female  PRE-OPERATIVE DIAGNOSIS:  persistent chronic L vulva abscess  POST-OPERATIVE DIAGNOSIS:  persistent chronic L vulva abscess  PROCEDURE:  Procedure(s): VULVAR BIOPSY (Left)  SURGEON:  Surgeon(s) and Role:    * Suzy Bouchardhomas J Taurus Willis, MD - Primary    * Christeen DouglasBethany Beasley, MD - Assisting  PHYSICIAN ASSISTANT:   ASSISTANTS: beasley   ANESTHESIA:   general  EBL:  Total I/O In: 400 [I.V.:400] Out: 20 [Blood:20]  BLOOD ADMINISTERED:none  DRAINS: none   LOCAL MEDICATIONS USED:  MARCAINE     SPECIMEN:  Source of Specimen:  vulvar excision biopsies  DISPOSITION OF SPECIMEN:  PATHOLOGY  COUNTS:  YES  TOURNIQUET:  * No tourniquets in log *  DICTATION: .Other Dictation: Dictation Number verbal  PLAN OF CARE: Discharge to home after PACU  PATIENT DISPOSITION:  PACU - hemodynamically stable.   Delay start of Pharmacological VTE agent (>24hrs) due to surgical blood loss or risk of bleeding: not applicable

## 2016-01-31 NOTE — Op Note (Signed)
NAME:  Tracie [name], Tracie [name]                ACCOUNT NO.:  [id number]  MEDICAL RECORD NO.:  [id number]  LOCATION:                               FACILITY:  [facility]  PHYSICIAN:  Jennell Cornerhomas Tavian Callander, MDDATE OF BIRTH:  08-Feb-1986  DATE OF PROCEDURE: DATE OF DISCHARGE:  01/31/2016                              OPERATIVE REPORT   PREOPERATIVE DIAGNOSIS:  Chronic hidradenitis suppurativa.  POSTOPERATIVE DIAGNOSIS:  Chronic hidradenitis suppurativa.  PROCEDURE PERFORMED:  Excisional vulvar biopsies x2.  SURGEON:  Jennell Cornerhomas Raymel Cull, MD  ANESTHESIA:  General endotracheal anesthesia.  SURGEON:  Jennell Cornerhomas Adelheid Hoggard, MD.  FIRST ASSISTANT:  Christeen DouglasBethany Beasley, MD.  INDICATION:  A 30 year old female with chronic vulvar abscess formation consistent with hidradenitis suppurativa.  The patient has been dealing with this for the past several years.  DESCRIPTION OF PROCEDURE:  After adequate general endotracheal anesthesia, the patient was placed in dorsal supine position.  Legs in the candy-cane stirrups.  Perineum and vagina were prepped with Betadine.  The patient was sterilely draped.  The patient did receive 1 g of IV Ancef prior to commencement of the case for prophylaxis.  A time- out was performed.  Straight catheterization of the bladder yielded 150 mL clear urine.  The left vulvar area lateral and inferior to the introitus were identified with thickened scarring.  Two active fistulous tracts noted.  Each of these areas were injected with lidocaine.  The superior lesion measured 3 x 2 cm and scarring in the inferior 2 x 1 cm elliptical incisions were made, approximately 5 cm in length for the superior and a 3.5 cm for the inferior lesion and tissue was dissected to the subcutaneous tissue to ensure the fistulous tracts were removed. The Bovie was used for hemostasis and the subcutaneous tissues were brought together with a running 2-0 chromic suture.  Two separate layers were used for the superior  defect and single deep layer was used for the inferior lesion and both biopsy sites were then closed with a running 4- 0 Vicryl subcuticular suture with good cosmetic effect.  Bacitracin was applied to both area with a 2 x 2 dressing and Tegaderm was placed over top of this.  There were no complications.  The patient tolerated the procedure well.  ESTIMATED BLOOD LOSS:  Minimal.  INTRAOPERATIVE FLUIDS:  400 mL.  The patient was taken to recovery room in good condition.          ______________________________ Jennell Cornerhomas Cavan Bearden, MD     TS/MEDQ  D:  01/31/2016  T:  01/31/2016  Job:  [id number]

## 2016-01-31 NOTE — Discharge Instructions (Signed)

## 2016-01-31 NOTE — Anesthesia Preprocedure Evaluation (Signed)
Anesthesia Evaluation  Patient identified by MRN, date of birth, ID band Patient awake    Reviewed: Allergy & Precautions, H&P , NPO status , Patient's Chart, lab work & pertinent test results  History of Anesthesia Complications Negative for: history of anesthetic complications  Airway Mallampati: II       Dental no notable dental hx.    Pulmonary neg pulmonary ROS,    Pulmonary exam normal        Cardiovascular negative cardio ROS Normal cardiovascular exam     Neuro/Psych negative neurological ROS     GI/Hepatic negative GI ROS, Neg liver ROS,   Endo/Other  negative endocrine ROS  Renal/GU negative Renal ROS  Female GU complaint     Musculoskeletal negative musculoskeletal ROS (+)   Abdominal   Peds negative pediatric ROS (+)  Hematology negative hematology ROS (+)   Anesthesia Other Findings   Reproductive/Obstetrics                             Anesthesia Physical  Anesthesia Plan  ASA: II  Anesthesia Plan: General   Post-op Pain Management:    Induction:   Airway Management Planned: LMA  Additional Equipment:   Intra-op Plan:   Post-operative Plan: Extubation in OR  Informed Consent: I have reviewed the patients History and Physical, chart, labs and discussed the procedure including the risks, benefits and alternatives for the proposed anesthesia with the patient or authorized representative who has indicated his/her understanding and acceptance.     Plan Discussed with: CRNA and Surgeon  Anesthesia Plan Comments:         Anesthesia Quick Evaluation

## 2016-01-31 NOTE — Transfer of Care (Signed)
Immediate Anesthesia Transfer of Care Note  Patient: Tracie [name]  Procedure(s) Performed: Procedure(s): VULVAR BIOPSY (Left)  Patient Location: PACU  Anesthesia Type:General  Level of Consciousness: awake, alert  and responds to stimulation  Airway & Oxygen Therapy: Patient Spontanous Breathing and Patient connected to face mask oxygen  Post-op Assessment: Report given to RN and Post -op Vital signs reviewed and stable  Post vital signs: Reviewed and stable  Last Vitals:  Filed Vitals:   01/31/16 1003 01/31/16 1232  BP: 153/74 128/78  Pulse: 95 119  Temp: 37.1 C 36.7 C  Resp: 16 18    Complications: No apparent anesthesia complications

## 2016-01-31 NOTE — Op Note (Deleted)
NAME:  [name], Tracie                ACCOUNT NO.:  [id number]  MEDICAL RECORD NO.:  [id number]  LOCATION:                               FACILITY:  [facility]  PHYSICIAN:  Thomas Schermerhorn, MDDATE OF BIRTH:  05/25/1986  DATE OF PROCEDURE: DATE OF DISCHARGE:  01/31/2016                              OPERATIVE REPORT   PREOPERATIVE DIAGNOSIS:  Chronic hidradenitis suppurativa.  POSTOPERATIVE DIAGNOSIS:  Chronic hidradenitis suppurativa.  PROCEDURE PERFORMED:  Excisional vulvar biopsies x2.  SURGEON:  Thomas Schermerhorn, MD  ANESTHESIA:  General endotracheal anesthesia.  SURGEON:  Thomas Schermerhorn, MD.  FIRST ASSISTANT:  Bethany Beasley, MD.  INDICATION:  A 29-year-old female with chronic vulvar abscess formation consistent with hidradenitis suppurativa.  The patient has been dealing with this for the past several years.  DESCRIPTION OF PROCEDURE:  After adequate general endotracheal anesthesia, the patient was placed in dorsal supine position.  Legs in the candy-cane stirrups.  Perineum and vagina were prepped with Betadine.  The patient was sterilely draped.  The patient did receive 1 g of IV Ancef prior to commencement of the case for prophylaxis.  A time- out was performed.  Straight catheterization of the bladder yielded 150 mL clear urine.  The left vulvar area lateral and inferior to the introitus were identified with thickened scarring.  Two active fistulous tracts noted.  Each of these areas were injected with lidocaine.  The superior lesion measured 3 x 2 cm and scarring in the inferior 2 x 1 cm elliptical incisions were made, approximately 5 cm in length for the superior and a 3.5 cm for the inferior lesion and tissue was dissected to the subcutaneous tissue to ensure the fistulous tracts were removed. The Bovie was used for hemostasis and the subcutaneous tissues were brought together with a running 2-0 chromic suture.  Two separate layers were used for the superior  defect and single deep layer was used for the inferior lesion and both biopsy sites were then closed with a running 4- 0 Vicryl subcuticular suture with good cosmetic effect.  Bacitracin was applied to both area with a 2 x 2 dressing and Tegaderm was placed over top of this.  There were no complications.  The patient tolerated the procedure well.  ESTIMATED BLOOD LOSS:  Minimal.  INTRAOPERATIVE FLUIDS:  400 mL.  The patient was taken to recovery room in good condition.          ______________________________ Thomas Schermerhorn, MD     TS/MEDQ  D:  01/31/2016  T:  01/31/2016  Job:  [id number] 

## 2016-02-01 ENCOUNTER — Encounter: Payer: Self-pay | Admitting: Obstetrics and Gynecology

## 2016-02-01 LAB — SURGICAL PATHOLOGY

## 2016-02-01 NOTE — Anesthesia Postprocedure Evaluation (Signed)
Anesthesia Post Note  Patient: Tracie [name]  Procedure(s) Performed: Procedure(s) (LRB): VULVAR BIOPSY (Left)  Patient location during evaluation: PACU Level of consciousness: awake and alert and oriented Pain management: pain level controlled Vital Signs Assessment: post-procedure vital signs reviewed and stable Respiratory status: spontaneous breathing Cardiovascular status: blood pressure returned to baseline Anesthetic complications: no    Last Vitals:  Filed Vitals:   01/31/16 1317 01/31/16 1345  BP: 111/77 113/70  Pulse: 99   Temp: 36.4 C   Resp: 20 20    Last Pain:  Filed Vitals:   02/01/16 0825  PainSc: 2                  Chirstopher Iovino

## 2016-06-07 ENCOUNTER — Encounter: Payer: Self-pay | Admitting: Primary Care

## 2016-06-07 ENCOUNTER — Ambulatory Visit (INDEPENDENT_AMBULATORY_CARE_PROVIDER_SITE_OTHER): Payer: 59 | Admitting: Primary Care

## 2016-06-07 DIAGNOSIS — G479 Sleep disorder, unspecified: Secondary | ICD-10-CM

## 2016-06-07 DIAGNOSIS — R51 Headache: Secondary | ICD-10-CM | POA: Diagnosis not present

## 2016-06-07 DIAGNOSIS — G4720 Circadian rhythm sleep disorder, unspecified type: Secondary | ICD-10-CM | POA: Insufficient documentation

## 2016-06-07 DIAGNOSIS — R519 Headache, unspecified: Secondary | ICD-10-CM

## 2016-06-07 HISTORY — DX: Circadian rhythm sleep disorder, unspecified type: G47.20

## 2016-06-07 NOTE — Patient Instructions (Addendum)
Continue Melatonin as needed for sleep. Do not exceed 10 mg of Melatonin in 24 hours. Take 1-2 hours prior to bedtime.  Try not to watch TV just prior to bed. Try taking a warm bath before bed. Continue essential oils.  Please schedule a physical with me in 3 months. You may also schedule a lab only appointment 3-4 days prior. We will discuss your lab results in detail during your physical.  It was a pleasure to meet you today! Please don't hesitate to call me with any questions. Welcome to [facility]!

## 2016-06-07 NOTE — Progress Notes (Signed)
Subjective:    Patient ID: Tracie [name], female    DOB: 1986-09-06, 30 y.o.   MRN: [id number]  HPI  Ms. Tracie [name] is a 30 year old female who presents today to establish care and discuss the problems mentioned below. Will obtain old records.  1) Frequent Headaches/Migraines: History of headaches nearly daily for the past 10 months. She is post partum 10 months and is currently breast feeding. She's undergone an eye exam and fitted with glasses, changed her diet, but has not noticed improvement.   Her headaches are located to the bilateral frontal and temporal lobes. She does experience nausea occasionally; denies photophobia. She will typically take tylenol and drink water which help to reduce/eliminate headaches. No prior history of frequent headaches in the past.   2) Insomnia: Difficulty falling asleep and does wake during the night. She is currently breast feeding her 6910 month old child and will wake every 3 hours for feedings. She also wakes early to care for her Autistic child. She denies daily worry, irritability, anxiety. She does experience mind racing thoughts during bedtime which inhibit her from sleeping. She's been taking Melatonin (unsure of dose) OTC without much improvement. She watches TV prior to bed and is also using essential oils. She gets 4 hours of sleep on average every night.  Review of Systems  HENT: Negative for congestion.   Eyes: Negative for photophobia.  Gastrointestinal:       Occasional nausea with headaches  Allergic/Immunologic: Negative for environmental allergies.  Neurological: Positive for headaches.  Psychiatric/Behavioral: Positive for sleep disturbance. The patient is not nervous/anxious.        Past Medical History:  Diagnosis Date  . Chicken pox   . Endometriosis 2013  . Medical history non-contributory      Social History   Social History  . Marital status: Married    Spouse name: N/A  . Number of children: N/A  . Years of education:  N/A   Occupational History  . Not on file.   Social History Main Topics  . Smoking status: Never Smoker  . Smokeless tobacco: Never Used  . Alcohol use No  . Drug use: No  . Sexual activity: Not on file   Other Topics Concern  . Not on file   Social History Narrative   Married.   2 children.    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   Enjoys painting, spending time with family.    Past Surgical History:  Procedure Laterality Date  . DIAGNOSTIC LAPAROSCOPY    . DILATION AND CURETTAGE OF UTERUS    . TONSILLECTOMY    . TONSILLECTOMY AND ADENOIDECTOMY  1994  . VULVA /PERINEUM BIOPSY Left 01/31/2016   Procedure: VULVAR BIOPSY;  Surgeon: Suzy Bouchardhomas J Schermerhorn, MD;  Location: [facility] ORS;  Service: Gynecology;  Laterality: Left;    Family History  Problem Relation Age of Onset  . Breast cancer Maternal Grandmother   . Skin cancer Maternal Grandmother   . Lung cancer Maternal Grandfather   . Prostate cancer Paternal Grandfather     No Known Allergies  Current Outpatient Prescriptions on File Prior to Visit  Medication Sig Dispense Refill  . Prenatal Vit-Fe Fumarate-FA (PRENATAL MULTIVITAMIN) TABS tablet Take 1 tablet by mouth daily at 12 noon. 180 tablet 1   No current facility-administered medications on file prior to visit.     BP 110/74   Pulse (!) 106   Temp 98.5 F (36.9 C) (Oral)   Ht  5' 3.75" (1.619 m)   Wt 208 lb (94.3 kg)   SpO2 97%   BMI 35.98 kg/m    Objective:   Physical Exam  Constitutional: She is oriented to person, place, and time. She appears well-nourished.  Eyes: EOM are normal. Pupils are equal, round, and reactive to light.  Neck: Neck supple.  Cardiovascular: Normal rate and regular rhythm.   Pulmonary/Chest: Effort normal and breath sounds normal.  Neurological: She is alert and oriented to person, place, and time. No cranial nerve deficit.  Skin: Skin is warm and dry.  Psychiatric: She has a normal mood and affect.           Assessment & Plan:

## 2016-06-07 NOTE — Assessment & Plan Note (Signed)
Irregular sleep pattern likely related to caring for young children who wake frequently during the night. Discussed Melatonin use, importance of good bedtime routine. Sleep during the weekends when husband at home. Dicussed stress reduction techniques.   Will continue to monitor.

## 2016-06-07 NOTE — Assessment & Plan Note (Signed)
Nearly daily x 10 months. Likely due to breastfeeding and lack of complete sleep at night. Strongly encouraged bedtime routine with full 7-8 hours of uninterrupted sleep.  Discussed ibuprofen use PRN. Continue hydration and limit caffeine.   Will continue to monitor. Neuro exam unremarkable.

## 2016-06-07 NOTE — Progress Notes (Signed)
Pre visit review using our clinic review tool, if applicable. No additional management support is needed unless otherwise documented below in the visit note. 

## 2016-07-10 ENCOUNTER — Telehealth: Payer: Self-pay | Admitting: Primary Care

## 2016-07-10 NOTE — Telephone Encounter (Signed)
-----   Message from Doreene NestKatherine K Nicolas Sisler, NP sent at 06/08/2016  7:11 AM EDT ----- Regarding: Headaches Please check on patient. How are her headaches? Is she sleeping any better?

## 2016-07-10 NOTE — Telephone Encounter (Signed)
Message left for patient to return my call.  

## 2016-07-11 NOTE — Telephone Encounter (Signed)
Noted and completely understand. Please have her see me when she is done breast-feeding for treatment if necessary.

## 2016-07-11 NOTE — Telephone Encounter (Signed)
Pt returned your call.  

## 2016-07-11 NOTE — Telephone Encounter (Signed)
Spoken to patient and she stated that the headaches are about the same. Since she is breastfeeding, she does not want to take anything not even Advil.  Sleeping is kind of better, it really depends on the day.

## 2016-07-12 NOTE — Telephone Encounter (Signed)
Spoken and notified patient of Kate's comments. Patient verbalized understanding. 

## 2016-09-04 ENCOUNTER — Other Ambulatory Visit: Payer: 59

## 2016-09-07 ENCOUNTER — Encounter: Payer: 59 | Admitting: Primary Care

## 2016-10-24 DIAGNOSIS — L02214 Cutaneous abscess of groin: Secondary | ICD-10-CM | POA: Diagnosis not present

## 2016-10-24 DIAGNOSIS — L732 Hidradenitis suppurativa: Secondary | ICD-10-CM | POA: Diagnosis not present

## 2017-01-29 DIAGNOSIS — L910 Hypertrophic scar: Secondary | ICD-10-CM | POA: Diagnosis not present

## 2017-01-29 DIAGNOSIS — L732 Hidradenitis suppurativa: Secondary | ICD-10-CM | POA: Diagnosis not present

## 2017-01-29 HISTORY — DX: Hypertrophic scar: L91.0

## 2017-03-29 ENCOUNTER — Ambulatory Visit (INDEPENDENT_AMBULATORY_CARE_PROVIDER_SITE_OTHER): Payer: 59 | Admitting: Primary Care

## 2017-03-29 ENCOUNTER — Encounter: Payer: Self-pay | Admitting: Primary Care

## 2017-03-29 VITALS — BP 122/76 | HR 86 | Temp 98.3°F | Ht 63.75 in | Wt 209.8 lb

## 2017-03-29 DIAGNOSIS — F331 Major depressive disorder, recurrent, moderate: Secondary | ICD-10-CM

## 2017-03-29 HISTORY — DX: Major depressive disorder, recurrent, moderate: F33.1

## 2017-03-29 MED ORDER — SERTRALINE HCL 50 MG PO TABS: 50.0000 mg | ORAL_TABLET | Freq: Every day | ORAL | 0 refills | Status: DC

## 2017-03-29 NOTE — Patient Instructions (Signed)
Start sertraline (Zoloft) 50 mg tablets for depression. Start by taking 1/2 tablet daily for 8 days, then advance to 1 full tablet thereafter.  Schedule a follow up visit in 6 weeks for re-evaluation.  Please message me if you have any questions or problems.  It was a pleasure to see you today!

## 2017-03-29 NOTE — Assessment & Plan Note (Signed)
PHQ 9 score of 18 today. Symptoms very suspicious of post partum depression. Discussed treatment options including therapy and medication, she elects for medication as she doesn't have the time or money for therapy.   Rx for Zoloft 50 mg sent to pharmacy. Patient is to take 1/2 tablet daily for 8 days, then advance to 1 full tablet thereafter. We discussed possible side effects of headache, GI upset, drowsiness, and SI/HI. If thoughts of SI/HI develop, we discussed to present to the emergency immediately. Patient verbalized understanding.   Follow up in 6 weeks for re-evaluation.

## 2017-03-29 NOTE — Progress Notes (Signed)
Subjective:    Patient ID: Tracie [name], female    DOB: 02-18-1986, 31 y.o.   MRN: [id number]  HPI  Tracie [name] is a 31 year old female with a history of insomnia who presents today with a chief complaint of depression. She is post partum 1.5 years. She has an older son around age 29 that is autistic and a younger son that is 111.465 years of age.  She's experience symptoms of "dullness", mind is always elsewhere, void of happiness, feeling sad, little to no motivation to do anything, doesn't want to get dressed or put on make up, she cancels plans as she doesn't want to go out. These symptoms have been present for the past 1.5 years, worse this year. PHQ 9 score of 18 and GAD 7 score of 7 days. She is not breast feeding. She's never been treated for depression in the past. She does remember feeling depressed after the birth of her first son.  Review of Systems  Constitutional: Positive for fatigue.  Gastrointestinal: Negative for abdominal pain.  Neurological: Negative for headaches.  Psychiatric/Behavioral:       See HPI       Past Medical History:  Diagnosis Date  . Chicken pox   . Endometriosis 2013  . Medical history non-contributory      Social History   Social History  . Marital status: Married    Spouse name: N/A  . Number of children: N/A  . Years of education: N/A   Occupational History  . Not on file.   Social History Main Topics  . Smoking status: Never Smoker  . Smokeless tobacco: Never Used  . Alcohol use No  . Drug use: No  . Sexual activity: Not on file   Other Topics Concern  . Not on file   Social History Narrative   Married.   2 children.    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   Enjoys painting, spending time with family.    Past Surgical History:  Procedure Laterality Date  . DIAGNOSTIC LAPAROSCOPY    . DILATION AND CURETTAGE OF UTERUS    . TONSILLECTOMY    . TONSILLECTOMY AND ADENOIDECTOMY  1994  . VULVA /PERINEUM BIOPSY Left  01/31/2016   Procedure: VULVAR BIOPSY;  Surgeon: Suzy Bouchardhomas J Schermerhorn, MD;  Location: [facility] OR[facility];  Service: Gynecology;  Laterality: Left;    Family History  Problem Relation Age of Onset  . Breast cancer Maternal Grandmother   . Skin cancer Maternal Grandmother   . Lung cancer Maternal Grandfather   . Prostate cancer Paternal Grandfather     Allergies  Allergen Reactions  . Cephalexin Swelling  . Hydrocodone-Acetaminophen Nausea Only    Current Outpatient Prescriptions on File Prior to Visit  Medication Sig Dispense Refill  . Prenatal Vit-Fe Fumarate-FA (PRENATAL MULTIVITAMIN) TABS tablet Take 1 tablet by mouth daily at 12 noon. 180 tablet 1   No current facility-administered medications on file prior to visit.     BP 122/76   Pulse 86   Temp 98.3 F (36.8 C) (Oral)   Ht 5' 3.75" (1.619 m)   Wt 209 lb 12.8 oz (95.2 kg)   SpO2 96%   BMI 36.30 kg/m    Objective:   Physical Exam  Constitutional: She appears well-nourished.  Neck: Neck supple.  Cardiovascular: Normal rate and regular rhythm.   Pulmonary/Chest: Effort normal and breath sounds normal.  Skin: Skin is warm and dry.  Psychiatric: She has a normal  mood and affect.          Assessment & Plan:

## 2017-04-08 DIAGNOSIS — R404 Transient alteration of awareness: Secondary | ICD-10-CM | POA: Diagnosis not present

## 2017-05-25 ENCOUNTER — Encounter: Payer: Self-pay | Admitting: Primary Care

## 2017-06-04 ENCOUNTER — Encounter: Payer: Self-pay | Admitting: Primary Care

## 2017-06-04 DIAGNOSIS — F331 Major depressive disorder, recurrent, moderate: Secondary | ICD-10-CM

## 2017-06-04 MED ORDER — SERTRALINE HCL 50 MG PO TABS: 75.0000 mg | ORAL_TABLET | Freq: Every day | ORAL | 0 refills | Status: DC

## 2017-06-04 NOTE — Telephone Encounter (Signed)
Please schedule for office visit follow-up in 2-3 weeks.

## 2017-06-04 NOTE — Telephone Encounter (Signed)
Message left for patient to return my call.  

## 2017-06-08 NOTE — Telephone Encounter (Signed)
Spoken to patient and schedule on 06/18/2017

## 2017-06-18 ENCOUNTER — Ambulatory Visit (INDEPENDENT_AMBULATORY_CARE_PROVIDER_SITE_OTHER): Payer: 59 | Admitting: Primary Care

## 2017-06-18 ENCOUNTER — Encounter: Payer: Self-pay | Admitting: Primary Care

## 2017-06-18 VITALS — BP 120/74 | HR 78 | Temp 98.1°F | Ht 63.75 in | Wt 200.0 lb

## 2017-06-18 DIAGNOSIS — F321 Major depressive disorder, single episode, moderate: Secondary | ICD-10-CM

## 2017-06-18 DIAGNOSIS — F331 Major depressive disorder, recurrent, moderate: Secondary | ICD-10-CM | POA: Diagnosis not present

## 2017-06-18 MED ORDER — FLUOXETINE HCL 20 MG PO TABS: 20.0000 mg | ORAL_TABLET | Freq: Every day | ORAL | 0 refills | Status: DC

## 2017-06-18 NOTE — Patient Instructions (Signed)
Start fluoxetine 20 mg tablets for depression.   Stop sertraline (Zoloft) tablets.  Please allow 3-4 weeks for medication to take effect. Please message me sooner if you have any problems.  Nice to see you!

## 2017-06-18 NOTE — Progress Notes (Signed)
Subjective:    Patient ID: Tracie [name], female    DOB: 1986-07-14, 31 y.o.   MRN: [id number]  HPI  Tracie [name] is a 31 year old female who presents today for follow up of depression.   She was evaluated in late June 2018 with complaints of depression, void of happiness, feeling sad, little motivation to do anything. Her PHQ 9 score was 18 and GAD 7 score was 7 so she was initiated on sertraline 50 mg and instructed to follow up. She messaged our office in late August 2018 with complaints of decreased effects of sertraline. The medication helped for "a while" but then noticed a decrease in effect. Her medication was increased to 75 mg in late August.   Since her dose increase she's not noticing any improvement. She endorses running out of her medication for 9 days in late July, refilled it, and has since then felt like she did prior to initiation of the medication. She continues to feel "gloomy", little motivation to do anything, irritability. She denies SI/HI.  Review of Systems  Respiratory: Negative for shortness of breath.   Cardiovascular: Negative for chest pain.  Gastrointestinal: Negative for abdominal pain and nausea.  Psychiatric/Behavioral:       See HPI       Past Medical History:  Diagnosis Date  . Chicken pox   . Endometriosis 2013  . Medical history non-contributory      Social History   Social History  . Marital status: Married    Spouse name: N/A  . Number of children: N/A  . Years of education: N/A   Occupational History  . Not on file.   Social History Main Topics  . Smoking status: Never Smoker  . Smokeless tobacco: Never Used  . Alcohol use No  . Drug use: No  . Sexual activity: Not on file   Other Topics Concern  . Not on file   Social History Narrative   Married.   2 children.    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   Enjoys painting, spending time with family.    Past Surgical History:  Procedure Laterality Date  .  DIAGNOSTIC LAPAROSCOPY    . DILATION AND CURETTAGE OF UTERUS    . TONSILLECTOMY    . TONSILLECTOMY AND ADENOIDECTOMY  1994  . VULVA /PERINEUM BIOPSY Left 01/31/2016   Procedure: VULVAR BIOPSY;  Surgeon: Suzy Bouchardhomas J Schermerhorn, MD;  Location: [facility] OR[facility];  Service: Gynecology;  Laterality: Left;    Family History  Problem Relation Age of Onset  . Breast cancer Maternal Grandmother   . Skin cancer Maternal Grandmother   . Lung cancer Maternal Grandfather   . Prostate cancer Paternal Grandfather     Allergies  Allergen Reactions  . Cephalexin Swelling  . Hydrocodone-Acetaminophen Nausea Only    Current Outpatient Prescriptions on File Prior to Visit  Medication Sig Dispense Refill  . augmented betamethasone dipropionate (DIPROLENE-AF) 0.05 % cream APPLY TO AFFECTED AREAS TWICE A DAY FOR SEVERE FLARES  3  . clindamycin (CLEOCIN) 300 MG capsule Take 300 mg by mouth 2 (two) times daily.  2  . desonide (DESOWEN) 0.05 % cream Apply 1 application topically 2 (two) times daily.    . Prenatal Vit-Fe Fumarate-FA (PRENATAL MULTIVITAMIN) TABS tablet Take 1 tablet by mouth daily at 12 noon. 180 tablet 1  . triamcinolone cream (KENALOG) 0.1 % APPLY TWICE DAILY TO RASH (NON FACIAL) TWICE DAILY UNTIL CLEAR     No current  facility-administered medications on file prior to visit.     BP 120/74   Pulse 78   Temp 98.1 F (36.7 C) (Oral)   Ht 5' 3.75" (1.619 m)   Wt 200 lb (90.7 kg)   SpO2 97%   BMI 34.60 kg/m    Objective:   Physical Exam  Constitutional: She appears well-nourished.  Neck: Neck supple.  Cardiovascular: Normal rate and regular rhythm.   Pulmonary/Chest: Effort normal and breath sounds normal.  Skin: Skin is warm and dry.  Psychiatric: She has a normal mood and affect.          Assessment & Plan:

## 2017-06-18 NOTE — Assessment & Plan Note (Signed)
Temporary improvement on initial dose of Zoloft, no improvement since late July despite dose change in late August. Discussed options, do not think that a 25 mg increase will change much so we will switch to fluoxetine. She will update in 3-4 weeks. Denies SI/HI.

## 2017-06-25 ENCOUNTER — Ambulatory Visit
Admission: RE | Admit: 2017-06-25 | Discharge: 2017-06-25 | Disposition: A | Discharge status: Home / Self Care | Admitting: Student in an Organized Health Care Education/Training Program

## 2017-06-25 DIAGNOSIS — Z79899 Other long term (current) drug therapy: Secondary | ICD-10-CM | POA: Diagnosis not present

## 2017-06-25 DIAGNOSIS — L732 Hidradenitis suppurativa: Secondary | ICD-10-CM | POA: Diagnosis not present

## 2017-06-25 MED ORDER — ADALIMUMAB 80 MG/0.8 ML SUBCUTANEOUS PEN KIT: kit | 0 refills | 0 days

## 2017-06-25 MED ORDER — OXYCODONE-ACETAMINOPHEN 5 MG-325 MG TABLET
ORAL_TABLET | 0 refills | 0 days | Status: CP
Start: 2017-06-25 — End: 2017-06-28

## 2017-06-25 MED ORDER — ADALIMUMAB PEN CITRATE FREE 40 MG/0.4 ML: each | 11 refills | 0 days | Status: AC

## 2017-06-25 MED ORDER — ADALIMUMAB PEN CITRATE FREE 40 MG/0.4 ML
SUBCUTANEOUS | 11 refills | 0.00000 days | Status: CP
Start: 2017-06-25 — End: 2017-07-01

## 2017-06-25 MED ORDER — ADALIMUMAB 80 MG/0.8 ML SUBCUTANEOUS PEN KIT
PACK | SUBCUTANEOUS | 0 refills | 0.00000 days | Status: CP
Start: 2017-06-25 — End: 2017-07-01

## 2017-06-26 NOTE — Unmapped (Signed)
Per test claim for HUMIRA PEN *NO CITRATE* at the [facility], patient needs Medication Assistance Program for Prior Authorization     .

## 2017-06-26 NOTE — Unmapped (Signed)
Addended by: Blima Singer on: 06/26/2017 01:32 PM     Modules accepted: Orders

## 2017-06-27 LAB — QUANTIFERON TB GOLD PLUS
Lab: NEGATIVE
QUANTIFERON ANTIGEN 1 MINUS NIL: -0.07 [IU]/mL
QUANTIFERON TB GOLD PLUS: NEGATIVE
QUANTIFERON TB NIL VALUE: 0.17 [IU]/mL

## 2017-06-27 MED ORDER — DOXYCYCLINE MONOHYDRATE 100 MG CAPSULE
ORAL_CAPSULE | Freq: Two times a day (BID) | ORAL | 2 refills | 0.00000 days | Status: CP
Start: 2017-06-27 — End: 2017-09-24

## 2017-06-27 NOTE — Unmapped (Signed)
Discussed with her 06/27/17.  Previously excised area is swollen and tender, wound is doing okay.  Doxycycline 100mg  by mouth twice daily.  Side effects such as GI upset, rash, and sun sensitivity were reviewed. Appropriate use was discussed.  Asked her to contact me if not better so  I can see her in clinic on Friday.

## 2017-06-28 MED ORDER — OXYCODONE-ACETAMINOPHEN 5 MG-325 MG TABLET
ORAL_TABLET | 0 refills | 0 days | Status: CP
Start: 2017-06-28 — End: 2017-08-16

## 2017-06-28 NOTE — Unmapped (Signed)
Patient called the nurse line stating she had a procedure by Dr Janyth Contes on Monday, 06/25/17 and that Dr Janyth Contes started her on an antibiotic, Doxycycline 100 mg yesterday.  Today the area is as big as a baseball, very hot and tender.  I realize that I have only been on the antibiotic for 1 day, but I feel I should come in on Friday as offered or will need some more pain medication if you want her to continue the antibiotic.  Please call me and let me know.

## 2017-06-29 ENCOUNTER — Ambulatory Visit: Admission: RE | Admit: 2017-06-29 | Discharge: 2017-06-29 | Disposition: A | Discharge status: Home / Self Care

## 2017-06-29 DIAGNOSIS — K603 Anal fistula: Secondary | ICD-10-CM | POA: Diagnosis not present

## 2017-06-29 DIAGNOSIS — L732 Hidradenitis suppurativa: Secondary | ICD-10-CM | POA: Diagnosis not present

## 2017-06-29 DIAGNOSIS — L0291 Cutaneous abscess, unspecified: Secondary | ICD-10-CM | POA: Diagnosis not present

## 2017-06-29 DIAGNOSIS — Z79899 Other long term (current) drug therapy: Secondary | ICD-10-CM | POA: Diagnosis not present

## 2017-06-29 NOTE — Unmapped (Signed)
ASSESSMENT/PLAN:  Hidradenitis suppurativa   1. I&D, culture, ILK as below.    2. Prior auth pending. Start Humira 160mg  day 1, then 80mg  day 15, then 40mg  [place] qweek starting day 29 for treatment of hidradenitis.  Discussed risks of tuberculosis, other uncommon infections, theoretical risk of malignancies, and other uncommon side effects. Baseline labs today  3. May add spironolactone in the future (already has Mirena)  4. Continue doxycycline 100mg  po twice daily.  Aerobic culture today.  5. Oxycodone/acetaminophen 5/325mg  q6H prn for post-op pain  6. 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  WIll set up for MRI w and w/out contrast of the pelvis to assess communication to rectum and sphincteric involvement.  Consider GI referral pending results.    After verbal informed consent was obtained, this site was cleansed with alcohol, anesthetized with 2% lidocaine with epinephrine and complicated I&D was performed with a 8mm punch tool and scissor to break loculations.  Site was left to heal by second intention and petrolatum and a bandage were applied.  Post care instructions were given.  Skin specimen sent for routine histology.  ~35ml of purulent material was expressed.  After the patient was informed of risks, benefits and side effects of intralesional steroid injection, the patient elected to undergo injection. Informed verbal consent was obtained. Risk of atrophy  and dyspigmentation with injection was explained. Kenalog 40 mg/ml was injected locally into the sites located L buttock in a clean fashion following alcohol prep.   Total volume in ml=0.5.  Number of sites treated: 3   Wound care was explained to the patient         RTC: 2-3 weeks        SUBJECTIVE:    CC: Hidradenitis Suppurativa    Courtney Wood is a 31 y.o. female  who is seen for followup of hidradenitis suppurativa.  Healing from unroofing of perianal area 5 days ago, but new concern of abscess in previous excision site over the last few days.  Had been calm for nearly a year with some pain starting just before her procedure on Monday, rapidly worsened the day after the procedure.  It started draining some last night but is still very painful.  Current treatment: clindamycin/rifampin for 3 months was somewhat helpful at reducing new flares in axillae and was just stopped. Started doxycycline 100mg  po twice daily yesterday when she called about this new flare.  Pain is improving some since drainage started.  The surgical wound seems okay to her.      Disease course:  Age when symptoms first noticed: Late teens  Date of diagnosis: 2010  Location of first symptoms: Groin Area   Typical involved areas include: Groin, Left axilla, Left butt cheek  Typical number of inflammatory lesions each month at baseline: 11  Disease triggers: Shaving   Family History: None   Current or former smoker? Former     Relationship to menses: sometimes   Current form of contraception: Mirena IUD  Response to previous hormonal therapy or contraception: none     Prior treatments:  Topical: Clindamycin, hibiclens, benzoyl peroxide   Systemic: Doxycycline   Procedures: Drain procedures    ED visits? none    ROS: the balance of 10 systems is negative unless otherwise documented.  No GI cramping, blood or mucous in stools.  1-2 BM daily.  OBJECTIVE:   Gen: Well-appearing patient, appropriate, interactive, in no acute distress  Skin: Examination of the scalp, face, neck, chest, back, abdomen, bilateral upper and lower extremities, hands, palms, soles, nails, buttocks, and external genitalia performed today and pertinent for:   Healing surgical site with granulation tissue on L perianal area.  2cm exophytic nodule with granulation tissue and several centimeters of surrounding erythema present on L buttock starting within previous scar.  location Abscess Inflamed nodule Non-inflamed nodule Draining sinus Non-draining Sinus [place] % scar   R axilla          L axilla          R inframammary          L inframammary          Intermammary          Pubic          R inguinal          R thigh          L inguinal          L thigh          Scrotum/Vulva          Perianal          R buttock          L buttock 1   1  2 10    Other (list)                      AN count (total sum of abscess and inflammatory nodule): 1  Pilonidal sinus? no

## 2017-07-01 MED ORDER — ADALIMUMAB PEN CITRATE FREE 40 MG/0.4 ML
SUBCUTANEOUS | 11 refills | 0.00000 days | Status: CP
Start: 2017-07-01 — End: 2018-06-04

## 2017-07-01 MED ORDER — ADALIMUMAB 80 MG/0.8 ML SUBCUTANEOUS PEN KIT
PACK | SUBCUTANEOUS | 0 refills | 0.00000 days | Status: CP
Start: 2017-07-01 — End: 2018-06-04

## 2017-07-04 NOTE — Unmapped (Signed)
Per test claim for HUMIRA PEN *NO CITRATE at the [facility], Required Specialty Pharmacy per insurance: [facility] [phone]) Specialty Pharmacy

## 2017-07-09 ENCOUNTER — Ambulatory Visit
Admission: RE | Admit: 2017-07-09 | Discharge: 2017-07-09 | Admitting: Student in an Organized Health Care Education/Training Program

## 2017-07-09 DIAGNOSIS — L732 Hidradenitis suppurativa: Principal | ICD-10-CM

## 2017-07-21 ENCOUNTER — Encounter: Payer: Self-pay | Admitting: Emergency Medicine

## 2017-07-21 ENCOUNTER — Emergency Department: Payer: 59

## 2017-07-21 ENCOUNTER — Emergency Department
Admission: EM | Admit: 2017-07-21 | Discharge: 2017-07-21 | Disposition: A | Discharge status: Home / Self Care | Payer: 59 | Attending: Emergency Medicine | Admitting: Emergency Medicine

## 2017-07-21 DIAGNOSIS — N200 Calculus of kidney: Secondary | ICD-10-CM | POA: Insufficient documentation

## 2017-07-21 DIAGNOSIS — N202 Calculus of kidney with calculus of ureter: Secondary | ICD-10-CM | POA: Diagnosis not present

## 2017-07-21 DIAGNOSIS — R109 Unspecified abdominal pain: Secondary | ICD-10-CM | POA: Diagnosis not present

## 2017-07-21 DIAGNOSIS — Z79899 Other long term (current) drug therapy: Secondary | ICD-10-CM | POA: Insufficient documentation

## 2017-07-21 DIAGNOSIS — N132 Hydronephrosis with renal and ureteral calculous obstruction: Secondary | ICD-10-CM | POA: Diagnosis not present

## 2017-07-21 DIAGNOSIS — R1031 Right lower quadrant pain: Secondary | ICD-10-CM | POA: Diagnosis not present

## 2017-07-21 LAB — URINALYSIS, COMPLETE (UACMP) WITH MICROSCOPIC
BACTERIA UA: NONE SEEN
BILIRUBIN URINE: NEGATIVE
Glucose, UA: NEGATIVE mg/dL
KETONES UR: 20 mg/dL — AB
LEUKOCYTES UA: NEGATIVE
NITRITE: NEGATIVE
Protein, ur: NEGATIVE mg/dL
Specific Gravity, Urine: 1.017 (ref 1.005–1.030)
pH: 6 (ref 5.0–8.0)

## 2017-07-21 LAB — POCT PREGNANCY, URINE: Preg Test, Ur: NEGATIVE

## 2017-07-21 LAB — BASIC METABOLIC PANEL
Anion gap: 10 (ref 5–15)
BUN: 12 mg/dL (ref 6–20)
CALCIUM: 9.3 mg/dL (ref 8.9–10.3)
CO2: 23 mmol/L (ref 22–32)
CREATININE: 0.65 mg/dL (ref 0.44–1.00)
Chloride: 104 mmol/L (ref 101–111)
Glucose, Bld: 104 mg/dL — ABNORMAL HIGH (ref 65–99)
Potassium: 3.6 mmol/L (ref 3.5–5.1)
SODIUM: 137 mmol/L (ref 135–145)

## 2017-07-21 LAB — CBC
HEMATOCRIT: 41.2 % (ref 35.0–47.0)
Hemoglobin: 14 g/dL (ref 12.0–16.0)
MCH: 28.9 pg (ref 26.0–34.0)
MCHC: 34 g/dL (ref 32.0–36.0)
MCV: 85 fL (ref 80.0–100.0)
PLATELETS: 215 10*3/uL (ref 150–440)
RBC: 4.85 MIL/uL (ref 3.80–5.20)
RDW: 13.3 % (ref 11.5–14.5)
WBC: 10 10*3/uL (ref 3.6–11.0)

## 2017-07-21 MED ORDER — TAMSULOSIN HCL 0.4 MG PO CAPS
0.4000 mg | ORAL_CAPSULE | Freq: Once | ORAL | Status: AC
  Administered 2017-07-21: 0.4 mg via ORAL

## 2017-07-21 MED ORDER — KETOROLAC TROMETHAMINE 30 MG/ML IJ SOLN
INTRAMUSCULAR | Status: AC
  Filled 2017-07-21: qty 1

## 2017-07-21 MED ORDER — OXYCODONE-ACETAMINOPHEN 5-325 MG PO TABS
2.0000 | ORAL_TABLET | Freq: Once | ORAL | Status: AC
  Administered 2017-07-21: 2 via ORAL

## 2017-07-21 MED ORDER — ONDANSETRON 4 MG PO TBDP: 4.0000 mg | ORAL_TABLET | Freq: Three times a day (TID) | ORAL | 0 refills | Status: DC | PRN

## 2017-07-21 MED ORDER — OXYCODONE-ACETAMINOPHEN 5-325 MG PO TABS: 1.0000 | ORAL_TABLET | Freq: Four times a day (QID) | ORAL | 0 refills | Status: DC | PRN

## 2017-07-21 MED ORDER — KETOROLAC TROMETHAMINE 30 MG/ML IJ SOLN
30.0000 mg | Freq: Once | INTRAMUSCULAR | Status: AC
  Administered 2017-07-21: 30 mg via INTRAVENOUS

## 2017-07-21 MED ORDER — TAMSULOSIN HCL 0.4 MG PO CAPS: 0.4000 mg | ORAL_CAPSULE | Freq: Every day | ORAL | 0 refills | Status: DC

## 2017-07-21 MED ORDER — TAMSULOSIN HCL 0.4 MG PO CAPS
ORAL_CAPSULE | ORAL | Status: AC
  Filled 2017-07-21: qty 1

## 2017-07-21 MED ORDER — OXYCODONE-ACETAMINOPHEN 5-325 MG PO TABS
ORAL_TABLET | ORAL | Status: AC
  Administered 2017-07-21: 2 via ORAL
  Filled 2017-07-21: qty 2

## 2017-07-21 MED ORDER — SODIUM CHLORIDE 0.9 % IV BOLUS (SEPSIS)
1000.0000 mL | Freq: Once | INTRAVENOUS | Status: AC
  Administered 2017-07-21: 1000 mL via INTRAVENOUS

## 2017-07-21 NOTE — ED Triage Notes (Signed)
Pt to ed with c/o acute onset of pain at 12pm.  Pt states pain is in right side of lower abd and radiates through to right flank area, also reports she feels like she needs to urinate constantly.  Went to urgent care and had UA done, which was negative, then was given  zofran and 60 mg of toradol.  Pt reports pain is better at this time.  Pt also states prior to pain starting pt had intercourse and felt like her IUD was "sharpe inside of me"  Pt reports she continues to have pain "inside of my uterus"  Also notes, vaginal spotting.

## 2017-07-21 NOTE — ED Notes (Signed)
Pt. States she was seen at [facility] today and was given a shot of toradol that gave relief.  Pt. States pain is similar to earlier today.

## 2017-07-21 NOTE — ED Notes (Signed)
ED Provider at bedside. 

## 2017-07-21 NOTE — ED Notes (Signed)
Patient transported to CT 

## 2017-07-21 NOTE — ED Provider Notes (Signed)
[facility] Emergency Department Provider Note  Time seen: 6:29 PM  I have reviewed the triage vital signs and the nursing notes.   HISTORY  Chief Complaint Flank Pain    HPI Tracie [name] is a 31 y.o. female With a past medical history of endometriosis who presents the emergency department for right flank pain. According to the patient since last night she has experienced right flank pain worse today, severe sharp pain with nausea and vomiting. Denies diarrhea. States she has noted blood in her urine with mild dysuria. Denies any history of kidney stones. Denies any fever. Denies vaginal bleeding, last period was  2 years ago.  Past Medical History:  Diagnosis Date  . Chicken pox   . Endometriosis 2013  . Medical history non-contributory     Patient Active Problem List   Diagnosis Date Noted  . Moderate episode of recurrent major depressive disorder (HCC) 03/29/2017  . Frequent headaches 06/07/2016  . Sleep pattern disturbance 06/07/2016    Past Surgical History:  Procedure Laterality Date  . DIAGNOSTIC LAPAROSCOPY    . DILATION AND CURETTAGE OF UTERUS    . TONSILLECTOMY    . TONSILLECTOMY AND ADENOIDECTOMY  1994  . VULVA /PERINEUM BIOPSY Left 01/31/2016   Procedure: VULVAR BIOPSY;  Surgeon: Suzy Bouchard, MD;  Location: [facility] ORS;  Service: Gynecology;  Laterality: Left;    Prior to Admission medications   Medication Sig Start Date End Date Taking? Authorizing Provider  augmented betamethasone dipropionate (DIPROLENE-AF) 0.05 % cream APPLY TO AFFECTED AREAS TWICE A DAY FOR SEVERE FLARES 03/08/17   [provider]  clindamycin (CLEOCIN) 300 MG capsule Take 300 mg by mouth 2 (two) times daily. 02/20/17   [provider]  desonide (DESOWEN) 0.05 % cream Apply 1 application topically 2 (two) times daily.    [provider]  FLUoxetine (PROZAC) 20 MG tablet Take 1 tablet (20 mg total) by mouth daily. 06/18/17   Doreene Nest, NP  Prenatal Vit-Fe Fumarate-FA (PRENATAL MULTIVITAMIN) TABS tablet Take 1 tablet by mouth daily at 12 noon. 08/01/15   Halfon, Leonette Most, MD  triamcinolone cream (KENALOG) 0.1 % APPLY TWICE DAILY TO RASH (NON FACIAL) TWICE DAILY UNTIL CLEAR 01/04/17   [provider]    Allergies  Allergen Reactions  . Cephalexin Swelling  . Hydrocodone-Acetaminophen Nausea Only    Family History  Problem Relation Age of Onset  . Breast cancer Maternal Grandmother   . Skin cancer Maternal Grandmother   . Lung cancer Maternal Grandfather   . Prostate cancer Paternal Grandfather     Social History Social History  Substance Use Topics  . Smoking status: Never Smoker  . Smokeless tobacco: Never Used  . Alcohol use No    Review of Systems Constitutional: Negative for fever Cardiovascular: Negative for chest pain. Respiratory: Negative for shortness of breath. Gastrointestinal: right flank pain. Positive for nausea and vomiting. Negative for diarrhea. Genitourinary: positive for hematuria, dysuria. Musculoskeletal: right back pain All other ROS negative  ____________________________________________   PHYSICAL EXAM:  VITAL SIGNS: ED Triage Vitals  Enc Vitals Group     BP 07/21/17 1627 137/71     Pulse Rate 07/21/17 1627 81     Resp 07/21/17 1627 18     Temp 07/21/17 1627 98.8 F (37.1 C)     Temp Source 07/21/17 1627 Oral     SpO2 07/21/17 1627 100 %     Weight 07/21/17 1628 200 lb (90.7 kg)  Height --      Head Circumference --      Peak Flow --      Pain Score 07/21/17 1627 10     Pain Loc --      Pain Edu? --      Excl. in GC? --     Constitutional: Alert and oriented. mild distress due to pain. Eyes: Normal exam ENT   Head: Normocephalic and atraumatic   Mouth/Throat: Mucous membranes are moist. Cardiovascular: Normal rate, regular rhythm. No murmur Respiratory: Normal respiratory effort without tachypnea nor retractions. Breath sounds are  clear Gastrointestinal: Soft and nontender. No distention.  There is no CVA tenderness. Musculoskeletal: Nontender with normal range of motion in all extremities.  Neurologic:  Normal speech and language. No gross focal neurologic deficits Skin:  Skin is warm, dry and intact.  Psychiatric: Mood and affect are normal.   ____________________________________________   RADIOLOGY  IMPRESSION: Obstructing 2 mm right UVJ calculus with marked hydronephrosis and ureteral dilatation.  ____________________________________________   INITIAL IMPRESSION / ASSESSMENT AND PLAN / ED COURSE  Pertinent labs & imaging results that were available during my care of the patient were reviewed by me and considered in my medical decision making (see chart for details).  patient presented to emergency department with right flank pain since last night, worse today area differential this time would include ureterolithiasis, pyelonephritis, urinary tract infection, musculoskeletal pain, colitis, ovarian cysts, gallbladder disease, appendicitis. Given the patient's urinary complaints and hematuria, with verified hematuria on labs are highly suspect ureterolithiasis versus pyelonephritis. No bacteria seen on microscopy. We'll obtain a CT renal scan to verify ureterolithiasis. Pregnancy test is negative. Labs are otherwise normal.  CT consistent with 2 mm right UVJ stone. Patient states her pain is a 1/10 currently. I discussed results with the patient, discussed return precautions. Discussed neurology follow-up. Patient agreeable to plan.  ____________________________________________   FINAL CLINICAL IMPRESSION(S) / ED DIAGNOSES  right flank pain Kidney stone   Minna Antis, MD 07/21/17 1940

## 2017-07-21 NOTE — ED Notes (Signed)
Pt. Going home with husband. 

## 2017-07-21 NOTE — ED Notes (Signed)
Pt. States pain to rt. Flank.  Pt. Denies hx of kidney stones.  Pt. Denies hx of gallbladder issues.  Pt. States last ate today around noon.

## 2017-07-23 LAB — URINE CULTURE: CULTURE: NO GROWTH

## 2017-08-01 ENCOUNTER — Ambulatory Visit
Admission: RE | Admit: 2017-08-01 | Discharge: 2017-08-01 | Disposition: A | Discharge status: Home / Self Care | Payer: Commercial Managed Care - PPO

## 2017-08-01 DIAGNOSIS — K603 Anal fistula: Secondary | ICD-10-CM | POA: Diagnosis not present

## 2017-08-01 DIAGNOSIS — L732 Hidradenitis suppurativa: Secondary | ICD-10-CM | POA: Diagnosis not present

## 2017-08-14 ENCOUNTER — Encounter: Payer: Self-pay | Admitting: Urology

## 2017-08-14 ENCOUNTER — Ambulatory Visit (INDEPENDENT_AMBULATORY_CARE_PROVIDER_SITE_OTHER): Payer: 59 | Admitting: Urology

## 2017-08-14 VITALS — BP 110/67 | HR 93 | Ht 64.0 in | Wt 186.0 lb

## 2017-08-14 DIAGNOSIS — N200 Calculus of kidney: Secondary | ICD-10-CM | POA: Diagnosis not present

## 2017-08-14 LAB — MICROSCOPIC EXAMINATION: RBC MICROSCOPIC, UA: NONE SEEN /HPF (ref 0–?)

## 2017-08-14 LAB — URINALYSIS, COMPLETE
Bilirubin, UA: NEGATIVE
Glucose, UA: NEGATIVE
KETONES UA: NEGATIVE
NITRITE UA: NEGATIVE
Protein, UA: NEGATIVE
RBC, UA: NEGATIVE
Specific Gravity, UA: 1.015 (ref 1.005–1.030)
Urobilinogen, Ur: 0.2 mg/dL (ref 0.2–1.0)
pH, UA: 7 (ref 5.0–7.5)

## 2017-08-14 NOTE — Progress Notes (Signed)
08/14/2017 3:40 PM   Tracie [name] Nov 18, 1985 [id number]  Referring provider: Doreene Nest, NP [address] [place], [place] [address]  Chief Complaint  Patient presents with  . Nephrolithiasis    New Patient    HPI: 31 year old female who presented to the emergency room with right flank pain on 07/21/2017.  She underwent a noncontrast CT scan which showed a 2 mm right UVJ stone with marked hydronephrosis and ureteral dilation which she subsequently passed.  There were no other urinary calculi present.    She is a extensive family history of kidney stones.  This is her first kidney stone event.    Denies any further flank pain.  She brings the stone with her today and would like it to be sent for stone analysis.  She does note today several risk factors for kidney stones.  She has been following a keto type diet over the past couple of months and lost a good amount of weight.  She is eating a lot of protein, greens, and low carbs.  She drinks very little water.  She is also being evaluated for Crohn's disease.   PMH: Past Medical History:  Diagnosis Date  . Chicken pox   . Endometriosis 2013  . 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 Medical history non-contributory   . Moderate episode of recurrent major depressive disorder (HCC) 03/29/2017  . Sleep pattern disturbance 06/07/2016  . Vulval hidradenitis suppurativa 09/08/2015  . Vulvar abscess 12/29/2015    Surgical History: Past Surgical History:  Procedure Laterality Date  . DIAGNOSTIC LAPAROSCOPY    . DILATION AND CURETTAGE OF UTERUS    . TONSILLECTOMY    . TONSILLECTOMY AND ADENOIDECTOMY  1994  . VULVA /PERINEUM BIOPSY Left 01/31/2016   Procedure: VULVAR BIOPSY;  Surgeon: Suzy Bouchard, MD;  Location: [facility] ORS;  Service: Gynecology;  Laterality: Left;    Home Medications:  Allergies as of 08/14/2017      Reactions   Cephalexin Swelling   Hydrocodone-acetaminophen Nausea Only   Hydrocodone-acetaminophen  Nausea Only      Medication List        Accurate as of 08/14/17 11:59 PM. Always use your most recent med list.          ADALIMUMAB [place] Maintenance dosing 40mg  [place] weekly.  L73.2 hidardenitis.   cetirizine 10 MG tablet Commonly known as:  ZYRTEC Take 10 mg by mouth.   doxycycline 100 MG capsule Commonly known as:  MONODOX TAKE 1 CAPSULE (100 MG TOTAL) BY MOUTH TWO (2) TIMES A DAY.   FLUoxetine 20 MG tablet Commonly known as:  PROZAC Take 1 tablet (20 mg total) by mouth daily.   oxyCODONE-acetaminophen 5-325 MG tablet Commonly known as:  ROXICET Take 1 tablet by mouth every 6 (six) hours as needed.   sertraline 50 MG tablet Commonly known as:  ZOLOFT TAKE 1.5 TABLETS (75 MG TOTAL) BY MOUTH DAILY.       Allergies:  Allergies  Allergen Reactions  . Cephalexin Swelling  . Hydrocodone-Acetaminophen Nausea Only  . Hydrocodone-Acetaminophen Nausea Only    Family History: Family History  Problem Relation Age of Onset  . Breast cancer Maternal Grandmother   . Skin cancer Maternal Grandmother   . Lung cancer Maternal Grandfather   . Prostate cancer Paternal Grandfather     Social History:  reports that  has never smoked. she has never used smokeless tobacco. She reports that she does not drink alcohol or use drugs.  ROS: UROLOGY Frequent  Urination?: No Hard to postpone urination?: No Burning/pain with urination?: No Get up at night to urinate?: No Leakage of urine?: No Urine stream starts and stops?: No Trouble starting stream?: No Do you have to strain to urinate?: No Blood in urine?: No Urinary tract infection?: No Sexually transmitted disease?: No Injury to kidneys or bladder?: No Painful intercourse?: No Weak stream?: No Currently pregnant?: No Vaginal bleeding?: No Last menstrual period?: 2016  Gastrointestinal Nausea?: No Vomiting?: No Indigestion/heartburn?: No Diarrhea?: No Constipation?: No  Constitutional Fever: No Night sweats?:  Yes Weight loss?: Yes Fatigue?: Yes  Skin Skin rash/lesions?: Yes Itching?: Yes  Eyes Blurred vision?: No Double vision?: No  Ears/Nose/Throat Sore throat?: No Sinus problems?: No  Hematologic/Lymphatic Swollen glands?: No Easy bruising?: No  Cardiovascular Leg swelling?: No Chest pain?: No  Respiratory Cough?: No Shortness of breath?: No  Endocrine Excessive thirst?: No  Musculoskeletal Back pain?: Yes Joint pain?: No  Neurological Headaches?: No Dizziness?: No  Psychologic Depression?: Yes Anxiety?: Yes  Physical Exam: BP 110/67   Pulse 93   Ht 5\' 4"  (1.626 m)   Wt 186 lb (84.4 kg)   BMI 31.93 kg/m   Constitutional:  Alert and oriented, No acute distress.  Accompanied by toddler son today. HEENT:  AT, moist mucus membranes.  Trachea midline, no masses. Cardiovascular: No clubbing, cyanosis, or edema. Respiratory: Normal respiratory effort, no increased work of breathing. GI: Abdomen is soft, nontender, nondistended, no abdominal masses.  Obese.  GU: No CVA tenderness.  Skin: No rashes, bruises or suspicious lesions. Neurologic: Grossly intact, no focal deficits, moving all 4 extremities. Psychiatric: Normal mood and affect.  Laboratory Data: Lab Results  Component Value Date   WBC 10.0 07/21/2017   HGB 14.0 07/21/2017   HCT 41.2 07/21/2017   MCV 85.0 07/21/2017   PLT 215 07/21/2017    Lab Results  Component Value Date   CREATININE 0.65 07/21/2017    Urinalysis Lab Results  Component Value Date   SPECGRAV 1.015 08/14/2017   PHUR 7.0 08/14/2017   COLORU Yellow 08/14/2017   APPEARANCEUR Clear 08/14/2017   LEUKOCYTESUR Trace (A) 08/14/2017   PROTEINUR Negative 08/14/2017   GLUCOSEU Negative 08/14/2017   KETONESU Negative 08/14/2017   RBCU Negative 08/14/2017   BILIRUBINUR Negative 08/14/2017   UUROB 0.2 08/14/2017   NITRITE Negative 08/14/2017    Lab Results  Component Value Date   LABMICR See below: 08/14/2017   WBCUA 0-5  08/14/2017   RBCUA None seen 08/14/2017   LABEPIT 0-10 08/14/2017   BACTERIA Few (A) 08/14/2017    Pertinent Imaging: Results for orders placed during the hospital encounter of 07/21/17  CT Renal Stone Study   Narrative CLINICAL DATA:  Acute onset of right lower abdominal and right flank pain 12 hours ago.  EXAM: CT ABDOMEN AND PELVIS WITHOUT CONTRAST  TECHNIQUE: Multidetector CT imaging of the abdomen and pelvis was performed following the standard protocol without IV contrast.  COMPARISON:  None.  FINDINGS: Lower chest: No acute abnormality.  Hepatobiliary: No focal liver abnormality is seen. No gallstones, gallbladder wall thickening, or biliary dilatation.  Pancreas: Unremarkable. No pancreatic ductal dilatation or surrounding inflammatory changes.  Spleen: Normal in size without focal abnormality.  Adrenals/Urinary Tract: Both adrenals are normal. No significant renal parenchymal lesions. There is an obstructed 2 mm calculus of the right ureterovesical junction with marked hydronephrosis and ureteral dilatation. No other urinary calculi are evident. Left kidney and ureter are normal. Urinary bladder is unremarkable.  Stomach/Bowel: Stomach is within  normal limits. Appendix is normal. No evidence of bowel wall thickening, distention, or inflammatory changes.  Vascular/Lymphatic: No significant vascular findings are present. No enlarged abdominal or pelvic lymph nodes.  Reproductive: Uterus and bilateral adnexa are unremarkable.  Other: Small volume free pelvic fluid. Small fat containing umbilical hernia  Musculoskeletal: No significant skeletal lesion.  IMPRESSION: Obstructing 2 mm right UVJ calculus with marked hydronephrosis and ureteral dilatation.   Electronically Signed   By: Ellery Plunkaniel R Mitchell M.D.   On: 07/21/2017 18:59    CT scan personally reviewed today.  Assessment & Plan:    1. Nephrolithiasis Interval passage of a 2 mm stone-sent for  stone analysis today We discussed general stone prevention techniques including drinking plenty water with goal of producing 2.5 L urine daily, increased citric acid intake, avoidance of high oxalate containing foods, and decreased salt intake.  Information about dietary recommendations given today. For 24-hour urine metabolic workup which she declined at this point in time given this is her first stone event - Urinalysis, Complete  This point in time, I am comfortable seeing her on a as needed basis  Vanna ScotlandAshley Alexismarie Flaim, MD  [facility] [address], [address] [place], [place] [address] [phone]

## 2017-08-16 ENCOUNTER — Ambulatory Visit
Admission: RE | Admit: 2017-08-16 | Discharge: 2017-08-16 | Disposition: A | Discharge status: Home / Self Care | Attending: Gastroenterology | Admitting: Gastroenterology

## 2017-08-16 DIAGNOSIS — K603 Anal fistula: Secondary | ICD-10-CM | POA: Diagnosis not present

## 2017-08-20 ENCOUNTER — Other Ambulatory Visit: Payer: Self-pay | Admitting: Urology

## 2017-08-20 ENCOUNTER — Telehealth: Payer: Self-pay | Admitting: Urology

## 2017-08-20 NOTE — Telephone Encounter (Signed)
Please review stone composition with this patient.  We discussed that her stones are likely calcium oxalate which is the case.  Recommendations are as discussed.  Vanna ScotlandAshley Vedder Brittian, MD

## 2017-08-21 NOTE — Telephone Encounter (Signed)
Spoke with pt in reference to stone analysis results. Pt voiced understanding.

## 2017-08-26 DIAGNOSIS — Z23 Encounter for immunization: Secondary | ICD-10-CM | POA: Diagnosis not present

## 2017-08-27 ENCOUNTER — Ambulatory Visit: Admission: RE | Admit: 2017-08-27 | Discharge: 2017-08-27

## 2017-08-27 DIAGNOSIS — L910 Hypertrophic scar: Secondary | ICD-10-CM | POA: Diagnosis not present

## 2017-09-17 ENCOUNTER — Other Ambulatory Visit: Payer: Self-pay | Admitting: Primary Care

## 2017-09-17 DIAGNOSIS — F321 Major depressive disorder, single episode, moderate: Secondary | ICD-10-CM

## 2017-09-18 ENCOUNTER — Ambulatory Visit
Admission: RE | Admit: 2017-09-18 | Discharge: 2017-09-18 | Disposition: A | Discharge status: Home / Self Care | Payer: Commercial Managed Care - PPO | Attending: Anesthesiology | Admitting: Anesthesiology

## 2017-09-18 ENCOUNTER — Ambulatory Visit
Admission: RE | Admit: 2017-09-18 | Discharge: 2017-09-18 | Disposition: A | Discharge status: Home / Self Care | Payer: Commercial Managed Care - PPO | Attending: Gastroenterology | Admitting: Gastroenterology

## 2017-09-18 DIAGNOSIS — K605 Anorectal fistula: Secondary | ICD-10-CM | POA: Diagnosis not present

## 2017-09-21 ENCOUNTER — Encounter: Payer: Self-pay | Admitting: Family Medicine

## 2017-09-21 ENCOUNTER — Ambulatory Visit (INDEPENDENT_AMBULATORY_CARE_PROVIDER_SITE_OTHER): Payer: 59 | Admitting: Family Medicine

## 2017-09-21 VITALS — BP 120/80 | HR 93 | Temp 97.9°F | Wt 188.0 lb

## 2017-09-21 DIAGNOSIS — H6690 Otitis media, unspecified, unspecified ear: Secondary | ICD-10-CM | POA: Insufficient documentation

## 2017-09-21 DIAGNOSIS — H66003 Acute suppurative otitis media without spontaneous rupture of ear drum, bilateral: Secondary | ICD-10-CM

## 2017-09-21 MED ORDER — AMOXICILLIN-POT CLAVULANATE 875-125 MG PO TABS: 1.0000 | ORAL_TABLET | Freq: Two times a day (BID) | ORAL | 0 refills | Status: AC

## 2017-09-21 NOTE — Patient Instructions (Addendum)
You do have right > left ear infection. Treat with amoxicillin antibiotic for 10 days. Use ibuprofen prescription strength 600mg  three times daily with meals for next 3-5 days.  Push fluids and rest.  Let us know if not improving with treatment.  Otitis Media, Adult Otitis media occurs when there is inflammation and fluid in the middle ear. Your middle ear is a part of the ear that contains bones for hearing as well as air that helps send sounds to your brain. What are the causes? This condition is caused by a blockage in the eustachian tube. This tube drains fluid from the ear to the back of the nose (nasopharynx). A blockage in this tube can be caused by an object or by swelling (edema) in the tube. Problems that can cause a blockage include:  A cold or other upper respiratory infection.  Allergies.  An irritant, such as tobacco smoke.  Enlarged adenoids. The adenoids are areas of soft tissue located high in the back of the throat, behind the nose and the roof of the mouth.  A mass in the nasopharynx.  Damage to the ear caused by pressure changes (barotrauma).  What are the signs or symptoms? Symptoms of this condition include:  Ear pain.  A fever.  Decreased hearing.  A headache.  Tiredness (lethargy).  Fluid leaking from the ear.  Ringing in the ear.  How is this diagnosed? This condition is diagnosed with a physical exam. During the exam your health care provider will use an instrument called an otoscope to look into your ear and check for redness, swelling, and fluid. He or she will also ask about your symptoms. Your health care provider may also order tests, such as:  A test to check the movement of the eardrum (pneumatic otoscopy). This test is done by squeezing a small amount of air into the ear.  A test that changes air pressure in the middle ear to check how well the eardrum moves and whether the eustachian tube is working (tympanogram).  How is this  treated? This condition usually goes away on its own within 3-5 days. But if the condition is caused by a bacteria infection and does not go away own its own, or keeps coming back, your health care provider may:  Prescribe antibiotic medicines to treat the infection.  Prescribe or recommend medicines to control pain.  Follow these instructions at home:  Take over-the-counter and prescription medicines only as told by your health care provider.  If you were prescribed an antibiotic medicine, take it as told by your health care provider. Do not stop taking the antibiotic even if you start to feel better.  Keep all follow-up visits as told by your health care provider. This is important. Contact a health care provider if:  You have bleeding from your nose.  There is a lump on your neck.  You are not getting better in 5 days.  You feel worse instead of better. Get help right away if:  You have severe pain that is not controlled with medicine.  You have swelling, redness, or pain around your ear.  You have stiffness in your neck.  A part of your face is paralyzed.  The bone behind your ear (mastoid) is tender when you touch it.  You develop a severe headache. Summary  Otitis media is redness, soreness, and swelling of the middle ear.  This condition usually goes away on its own within 3-5 days.  If the problem does not  go away in 3-5 days, your health care provider may prescribe or recommend medicines to treat your symptoms.  If you were prescribed an antibiotic medicine, take it as told by your health care provider. This information is not intended to replace advice given to you by your health care provider. Make sure you discuss any questions you have with your health care provider. Document Released: 06/30/2004 Document Revised: 09/15/2016 Document Reviewed: 09/15/2016 Elsevier Interactive Patient Education  2017 [employer]

## 2017-09-21 NOTE — Progress Notes (Signed)
BP 120/80 (BP Location: Left Arm, Patient Position: Sitting, Cuff Size: Normal)   Pulse 93   Temp 97.9 F (36.6 C) (Oral)   Wt 188 lb (85.3 kg)   SpO2 96%   BMI 32.27 kg/m    CC: R ear pain Subjective:    Patient ID: Tracie [name], female    DOB: May 02, 1986, 31 y.o.   MRN: [id number]  HPI: Tracie [name] is a 31 y.o. female presenting on 09/21/2017 for Ear Pain (right ear, constant.severe pain. Started last night. Taken Tylenol Extra Strength, barely helpful. Also, applied warm comress and tea tree oil. H/o ear infection)   1d h/o R earache - severe and had trouble sleeping. Radiation of pain down neck. Treating with tylenol and warm compresses and tea tree oil. No significant improvement. Some dizziness. Some congestion, ST and drainage and fatigue. + tinnitus and muffled hearing.   Children and husband sick recently.  No fevers/chills, draining from ear, tooth pain, headache. Treating with  dayquil and nyquil.   Relevant past medical, surgical, family and social history reviewed and updated as indicated. Interim medical history since our last visit reviewed. Allergies and medications reviewed and updated. Outpatient Medications Prior to Visit  Medication Sig Dispense Refill  . ADALIMUMAB [place] Maintenance dosing 40mg  [place] weekly.  L73.2 hidardenitis.    [place]. cetirizine (ZYRTEC) 10 MG tablet Take 10 mg by mouth.    . doxycycline (MONODOX) 100 MG capsule TAKE 1 CAPSULE (100 MG TOTAL) BY MOUTH TWO (2) TIMES A DAY.  2  . FLUoxetine (PROZAC) 20 MG tablet TAKE 1 TABLET BY MOUTH EVERY DAY 90 tablet 0  . oxyCODONE-acetaminophen (ROXICET) 5-325 MG tablet Take 1 tablet by mouth every 6 (six) hours as needed. 12 tablet 0  . sertraline (ZOLOFT) 50 MG tablet TAKE 1.5 TABLETS (75 MG TOTAL) BY MOUTH DAILY.  0   No facility-administered medications prior to visit.      Per HPI unless specifically indicated in ROS section below Review of Systems     Objective:    BP 120/80 (BP Location: Left  Arm, Patient Position: Sitting, Cuff Size: Normal)   Pulse 93   Temp 97.9 F (36.6 C) (Oral)   Wt 188 lb (85.3 kg)   SpO2 96%   BMI 32.27 kg/m   Wt Readings from Last 3 Encounters:  09/21/17 188 lb (85.3 kg)  08/14/17 186 lb (84.4 kg)  07/21/17 200 lb (90.7 kg)    Physical Exam  Constitutional: She appears well-developed and well-nourished. No distress.  HENT:  Head: Normocephalic and atraumatic.  Right Ear: Ear canal normal. There is swelling and tenderness. Tympanic membrane is injected, erythematous and bulging. Tympanic membrane mobility is abnormal. Decreased hearing is noted.  Left Ear: External ear and ear canal normal.  Nose: Mucosal edema present. No rhinorrhea. Right sinus exhibits no maxillary sinus tenderness and no frontal sinus tenderness. Left sinus exhibits no maxillary sinus tenderness and no frontal sinus tenderness.  Mouth/Throat: Uvula is midline, oropharynx is clear and moist and mucous membranes are normal. No oropharyngeal exudate, posterior oropharyngeal edema, posterior oropharyngeal erythema or tonsillar abscesses.  L TM erythematous  R TM markedly erythematous, bulging, swollen   No perf appreciated  Eyes: Conjunctivae and EOM are normal. Pupils are equal, round, and reactive to light. No scleral icterus.  Neck: Normal range of motion. Neck supple.  Cardiovascular: Normal rate, regular rhythm, normal heart sounds and intact distal pulses.  No murmur heard. Pulmonary/Chest: Effort normal and breath sounds normal. No  respiratory distress. She has no wheezes. She has no rales.  Lymphadenopathy:    She has no cervical adenopathy.  Skin: Skin is warm and dry. No rash noted.  Nursing note and vitals reviewed.      Assessment & Plan:   Problem List Items Addressed This Visit    Acute otitis media - Primary    Acute suppurative R>L otitis media. Treat with augmentin course. Discussed ibuprofen 600mg  for discomfort. Update if not improving with treatment.    Pt agrees with plan.  She states she has tolerated amoxicillin in the past despite her keflex allergy listed.       Relevant Medications   amoxicillin-clavulanate (AUGMENTIN) 875-125 MG tablet       Follow up plan: Return if symptoms worsen or fail to improve.  Eustaquio BoydenJavier Juley Giovanetti, MD

## 2017-09-21 NOTE — Assessment & Plan Note (Addendum)
Acute suppurative R>L otitis media. Treat with augmentin course. Discussed ibuprofen 600mg  for discomfort. Update if not improving with treatment.  Pt agrees with plan.  She states she has tolerated amoxicillin in the past despite her keflex allergy listed.

## 2017-09-24 MED ORDER — DOXYCYCLINE MONOHYDRATE 100 MG CAPSULE
ORAL_CAPSULE | Freq: Two times a day (BID) | ORAL | 2 refills | 0.00000 days | Status: CP
Start: 2017-09-24 — End: 2017-12-23

## 2017-12-27 ENCOUNTER — Other Ambulatory Visit: Payer: Self-pay | Admitting: Primary Care

## 2017-12-27 DIAGNOSIS — F321 Major depressive disorder, single episode, moderate: Secondary | ICD-10-CM

## 2018-01-09 ENCOUNTER — Ambulatory Visit (INDEPENDENT_AMBULATORY_CARE_PROVIDER_SITE_OTHER): Payer: 59 | Admitting: Primary Care

## 2018-01-09 ENCOUNTER — Encounter: Payer: Self-pay | Admitting: Primary Care

## 2018-01-09 VITALS — BP 108/68 | HR 84 | Temp 98.0°F | Wt 180.5 lb

## 2018-01-09 DIAGNOSIS — F331 Major depressive disorder, recurrent, moderate: Secondary | ICD-10-CM | POA: Diagnosis not present

## 2018-01-09 MED ORDER — FLUOXETINE HCL 40 MG PO CAPS: 40.0000 mg | ORAL_CAPSULE | Freq: Every day | ORAL | 1 refills | Status: DC

## 2018-01-09 NOTE — Patient Instructions (Signed)
We've increased the dose of your fluoxetine from 20 mg to 40 mg. You may take two of your 20 mg capsules to equal 40 until your bottle is empty.  You will be contacted regarding your referral to therapy.  Please let us know if you have not been contacted within one week.   Please schedule a follow up appointment in 6 weeks for re-evaluation.   It was a pleasure to see you today!

## 2018-01-09 NOTE — Progress Notes (Signed)
Subjective:    Patient ID: Tracie [name], female    DOB: 12-06-1985, 32 y.o.   MRN: [id number]  HPI  Tracie [name] is a 32 year old female with a history of major depressive disorder who presents today for follow up.  She was last evaluated in September 2018 with reports of feeling "gloomy", little motivation to do anything, irritability. She was on Zoloft previously but stopped taking the medication last Summer as she ran out and didn't feel like it helped. During her last visit she was initiated on Fluoxetine 20 mg and was told to update [place] four weeks later.  Since her last visit she's noticed improvement on fluoxetine. Positive effects on fluoxetine include feeling much calmer, she is less irritable, more patience with family. Over the past 1-2 months she's noticed that the medication has not been as effective as it once was.   She has noticed symptoms of little motivation to go out and do things that she once enjoyed. She has also started watching another child during the day, is taking care of her austistic son, and is struggling with financial issues. Her husband has told her that she's more distant towards him.  She denies SI/HI. PHQ 9 score of 13 today. She denies SI/HI.   Review of Systems  Respiratory: Negative for shortness of breath.   Cardiovascular: Negative for chest pain.  Psychiatric/Behavioral: Positive for sleep disturbance.       See HPI       Past Medical History:  Diagnosis Date  . Chicken pox   . Endometriosis 2013  . 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 Medical history non-contributory   . Moderate episode of recurrent major depressive disorder (HCC) 03/29/2017  . Sleep pattern disturbance 06/07/2016  . Vulval hidradenitis suppurativa 09/08/2015  . Vulvar abscess 12/29/2015     Social History   Socioeconomic History  . Marital status: Married    Spouse name: Not on file  . Number of children: Not on file  . Years of education: Not on file  . Highest education  level: Not on file  Occupational History  . Not on file  Social Needs  . Financial resource strain: Not on file  . Food insecurity:    Worry: Not on file    Inability: Not on file  . Transportation needs:    Medical: Not on file    Non-medical: Not on file  Tobacco Use  . Smoking status: Never Smoker  . Smokeless tobacco: Never Used  Substance and Sexual Activity  . Alcohol use: Yes  . Drug use: No  . Sexual activity: Not on file  Lifestyle  . Physical activity:    Days per week: Not on file    Minutes per session: Not on file  . Stress: Not on file  Relationships  . Social connections:    Talks on phone: Not on file    Gets together: Not on file    Attends religious service: Not on file    Active member of club or organization: Not on file    Attends meetings of clubs or organizations: Not on file    Relationship status: Not on file  . Intimate partner violence:    Fear of current or ex partner: Not on file    Emotionally abused: Not on file    Physically abused: Not on file    Forced sexual activity: Not on file  Other Topics Concern  . Not on file  Social History Narrative  Married.   2 children.    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   Enjoys painting, spending time with family.    Past Surgical History:  Procedure Laterality Date  . DIAGNOSTIC LAPAROSCOPY    . DILATION AND CURETTAGE OF UTERUS    . TONSILLECTOMY    . TONSILLECTOMY AND ADENOIDECTOMY  1994  . VULVA /PERINEUM BIOPSY Left 01/31/2016   Procedure: VULVAR BIOPSY;  Surgeon: Suzy Bouchardhomas J Schermerhorn, MD;  Location: [facility] ORS;  Service: Gynecology;  Laterality: Left;    Family History  Problem Relation Age of Onset  . Breast cancer Maternal Grandmother   . Skin cancer Maternal Grandmother   . Lung cancer Maternal Grandfather   . Prostate cancer Paternal Grandfather     Allergies  Allergen Reactions  . Cephalexin Swelling  . Hydrocodone-Acetaminophen Nausea Only  .  Hydrocodone-Acetaminophen Nausea Only    Current Outpatient Medications on File Prior to Visit  Medication Sig Dispense Refill  . ADALIMUMAB [place] Maintenance dosing 40mg  [place] weekly.  L73.2 hidardenitis.    Marland Kitchen. cetirizine (ZYRTEC) 10 MG tablet Take 10 mg by mouth.    . doxycycline (MONODOX) 100 MG capsule TAKE 1 CAPSULE (100 MG TOTAL) BY MOUTH TWO (2) TIMES A DAY.  2   No current facility-administered medications on file prior to visit.     BP 108/68   Pulse 84   Temp 98 F (36.7 C)   Wt 180 lb 8 oz (81.9 kg)   BMI 30.98 kg/m    Objective:   Physical Exam  Constitutional: She appears well-nourished.  Neck: Neck supple.  Cardiovascular: Normal rate and regular rhythm.  Pulmonary/Chest: Effort normal and breath sounds normal.  Skin: Skin is warm and dry.  Psychiatric: She has a normal mood and affect.          Assessment & Plan:

## 2018-01-09 NOTE — Assessment & Plan Note (Signed)
Deteriorated on fluoxetine 20 mg. Also suspect stress from watching 2-3 children during the day with little to no time for herself is contributing.  Discussed options for treatment including therapy and increased dose of medication. Referral placed to therapy as I believe she'll benefit. Increased Fluoxetine to 40 mg once daily.   Follow up in 6 weeks for further evaluation.

## 2018-01-22 ENCOUNTER — Ambulatory Visit (INDEPENDENT_AMBULATORY_CARE_PROVIDER_SITE_OTHER): Payer: 59 | Admitting: Psychology

## 2018-01-22 DIAGNOSIS — F331 Major depressive disorder, recurrent, moderate: Secondary | ICD-10-CM | POA: Diagnosis not present

## 2018-01-29 ENCOUNTER — Ambulatory Visit: Payer: 59 | Admitting: Psychology

## 2018-02-21 ENCOUNTER — Ambulatory Visit: Payer: 59 | Admitting: Primary Care

## 2018-03-06 ENCOUNTER — Other Ambulatory Visit: Payer: Self-pay | Admitting: Primary Care

## 2018-03-06 DIAGNOSIS — F331 Major depressive disorder, recurrent, moderate: Secondary | ICD-10-CM

## 2018-03-11 ENCOUNTER — Encounter: Payer: Self-pay | Admitting: Primary Care

## 2018-03-13 ENCOUNTER — Ambulatory Visit (INDEPENDENT_AMBULATORY_CARE_PROVIDER_SITE_OTHER): Payer: 59 | Admitting: Primary Care

## 2018-03-13 ENCOUNTER — Other Ambulatory Visit (HOSPITAL_COMMUNITY)
Admission: RE | Admit: 2018-03-13 | Discharge: 2018-03-13 | Disposition: A | Discharge status: Home / Self Care | Payer: 59 | Source: Ambulatory Visit | Attending: Primary Care | Admitting: Primary Care

## 2018-03-13 ENCOUNTER — Encounter: Payer: Self-pay | Admitting: Primary Care

## 2018-03-13 VITALS — BP 118/78 | HR 89 | Temp 98.6°F | Ht 64.0 in | Wt 169.0 lb

## 2018-03-13 DIAGNOSIS — Z113 Encounter for screening for infections with a predominantly sexual mode of transmission: Secondary | ICD-10-CM

## 2018-03-13 DIAGNOSIS — Z124 Encounter for screening for malignant neoplasm of cervix: Secondary | ICD-10-CM | POA: Insufficient documentation

## 2018-03-13 DIAGNOSIS — Z1151 Encounter for screening for human papillomavirus (HPV): Secondary | ICD-10-CM | POA: Insufficient documentation

## 2018-03-13 NOTE — Progress Notes (Signed)
Subjective:    Patient ID: Tracie [name], female    DOB: [birth year], 32 y.o.   MRN: [id number]  HPI  Tracie [name] is a 32 year old female who presents today for STD testing, is also needing a pap smear.   She and her husband were recently in an open relationship with another couple. She had intercourse with a new female partner four days ago, used a condom for most of the time except for towards then end of intercourse. She did have oral intercourse. She is very nervous about the experience in regards to potential STD's, thinks she feels a cold sore to her left inner lip.   She denies vaginal lesions, vaginal discharge, vaginal itching, urinary symptoms. She has an IUD to prevent pregnancy.   Review of Systems  Constitutional: Negative for fever.  Gastrointestinal: Negative for abdominal pain.  Genitourinary: Negative for dysuria, frequency, hematuria, pelvic pain and vaginal discharge.       Past Medical History:  Diagnosis Date  . Chicken pox   . Endometriosis 2013  . 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 Medical history non-contributory   . Moderate episode of recurrent major depressive disorder (HCC) 03/29/2017  . Sleep pattern disturbance 06/07/2016  . Vulval hidradenitis suppurativa 09/08/2015  . Vulvar abscess 12/29/2015     Social History   Socioeconomic History  . Marital status: Married    Spouse name: Not on file  . Number of children: Not on file  . Years of education: Not on file  . Highest education level: Not on file  Occupational History  . Not on file  Social Needs  . Financial resource strain: Not on file  . Food insecurity:    Worry: Not on file    Inability: Not on file  . Transportation needs:    Medical: Not on file    Non-medical: Not on file  Tobacco Use  . Smoking status: Never Smoker  . Smokeless tobacco: Never Used  Substance and Sexual Activity  . Alcohol use: Yes  . Drug use: No  . Sexual activity: Not on file  Lifestyle  . Physical activity:      Days per week: Not on file    Minutes per session: Not on file  . Stress: Not on file  Relationships  . Social connections:    Talks on phone: Not on file    Gets together: Not on file    Attends religious service: Not on file    Active member of club or organization: Not on file    Attends meetings of clubs or organizations: Not on file    Relationship status: Not on file  . Intimate partner violence:    Fear of current or ex partner: Not on file    Emotionally abused: Not on file    Physically abused: Not on file    Forced sexual activity: Not on file  Other Topics Concern  . Not on file  Social History Narrative   Married.   2 children.    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   Enjoys painting, spending time with family.    Past Surgical History:  Procedure Laterality Date  . DIAGNOSTIC LAPAROSCOPY    . DILATION AND CURETTAGE OF UTERUS    . TONSILLECTOMY    . TONSILLECTOMY AND ADENOIDECTOMY  1994  . VULVA /PERINEUM BIOPSY Left 01/31/2016   Procedure: VULVAR BIOPSY;  Surgeon: Suzy Bouchardhomas J Schermerhorn, MD;  Location: [facility] ORS;  Service: Gynecology;  Laterality: Left;    Family History  Problem Relation Age of Onset  . Breast cancer Maternal Grandmother   . Skin cancer Maternal Grandmother   . Lung cancer Maternal Grandfather   . Prostate cancer Paternal Grandfather     Allergies  Allergen Reactions  . Cephalexin Swelling  . Hydrocodone-Acetaminophen Nausea Only  . Hydrocodone-Acetaminophen Nausea Only    Current Outpatient Medications on File Prior to Visit  Medication Sig Dispense Refill  . ADALIMUMAB [place] Maintenance dosing 40mg  [place] weekly.  L73.2 hidardenitis.    Marland Kitchen cetirizine (ZYRTEC) 10 MG tablet Take 10 mg by mouth.    . doxycycline (MONODOX) 100 MG capsule TAKE 1 CAPSULE (100 MG TOTAL) BY MOUTH TWO (2) TIMES A DAY.  2  . FLUoxetine (PROZAC) 40 MG capsule TAKE 1 CAPSULE BY MOUTH EVERY DAY 30 capsule 0   No current facility-administered  medications on file prior to visit.     BP 118/78   Pulse 89   Temp 98.6 F (37 C) (Oral)   Ht 5\' 4"  (1.626 m)   Wt 169 lb (76.7 kg)   SpO2 98%   BMI 29.01 kg/m    Objective:   Physical Exam  Constitutional: She appears well-nourished.  Neck: Neck supple.  Cardiovascular: Normal rate and regular rhythm.  Respiratory: Effort normal and breath sounds normal.  Genitourinary:    There is no tenderness or lesion on the right labia. There is no tenderness or lesion on the left labia. Cervix exhibits no motion tenderness. Right adnexum displays no mass and no tenderness. Left adnexum displays no mass and no tenderness. No erythema in the vagina. No vaginal discharge found.  Genitourinary Comments: Hypertrophic scar to left perineum. Scant amount of whitish discharge from cervix. No foul smell. Visualization of IUD string.  Skin: Skin is warm and dry.           Assessment & Plan:  Screening for STD and Cervical Cancer:  Unprotected intercourse with new female partner, also oral intercourse. Exam today unremarkable. Labs pending today including RPR, gonorrhea, chlamydia, HSV, Hep C. Will obtain HIV in 3 weeks given that she had unprotected intercourse 4 days ago. Pap smear collected today. Discussed importance of protection during intercourse. Await labs.  Doreene Nest, NP

## 2018-03-13 NOTE — Patient Instructions (Signed)
Stop by the lab prior to leaving today. I will notify you of your results once received.   Schedule a lab only appointment to return in 3 weeks for additional testing.  It was a pleasure to see you today!

## 2018-03-14 LAB — CYTOLOGY - PAP
DIAGNOSIS: NEGATIVE
HPV: NOT DETECTED

## 2018-03-17 LAB — HCV RNA,QUANTITATIVE REAL TIME PCR
HCV Quantitative Log: <1.18 Log IU/mL
HCV RNA, PCR, QN: <15 IU/mL

## 2018-03-17 LAB — WET PREP BY MOLECULAR PROBE
Candida species: NOT DETECTED
Gardnerella vaginalis: NOT DETECTED
MICRO NUMBER: 90676573
SPECIMEN QUALITY:: ADEQUATE
Trichomonas vaginosis: NOT DETECTED

## 2018-03-17 LAB — HSV(HERPES SIMPLEX VRS) I + II AB-IGG: HSV 2 IGG,TYPE SPECIFIC AB: <0.9 index

## 2018-03-17 LAB — C. TRACHOMATIS/N. GONORRHOEAE RNA
C. trachomatis RNA, TMA: NOT DETECTED
N. GONORRHOEAE RNA, TMA: NOT DETECTED

## 2018-03-17 LAB — RPR: RPR: NONREACTIVE

## 2018-03-17 LAB — HEPATITIS C ANTIBODY
HEP C AB: REACTIVE — AB
SIGNAL TO CUT-OFF: 1.69 — ABNORMAL HIGH (ref <1.00)

## 2018-03-17 LAB — HSV 1/2 AB (IGM), IFA W/RFLX TITER
HSV 1 IgM Screen: NEGATIVE
HSV 2 IgM Screen: NEGATIVE

## 2018-04-03 ENCOUNTER — Other Ambulatory Visit (INDEPENDENT_AMBULATORY_CARE_PROVIDER_SITE_OTHER): Payer: 59

## 2018-04-03 DIAGNOSIS — Z113 Encounter for screening for infections with a predominantly sexual mode of transmission: Secondary | ICD-10-CM

## 2018-04-04 ENCOUNTER — Encounter: Payer: Self-pay | Admitting: Primary Care

## 2018-04-04 LAB — HIV ANTIBODY (ROUTINE TESTING W REFLEX): HIV 1&2 Ab, 4th Generation: NONREACTIVE

## 2018-04-17 ENCOUNTER — Encounter: Payer: Self-pay | Admitting: Primary Care

## 2018-04-18 ENCOUNTER — Other Ambulatory Visit: Payer: Self-pay | Admitting: Primary Care

## 2018-04-18 DIAGNOSIS — Z0000 Encounter for general adult medical examination without abnormal findings: Secondary | ICD-10-CM

## 2018-04-29 ENCOUNTER — Ambulatory Visit (INDEPENDENT_AMBULATORY_CARE_PROVIDER_SITE_OTHER): Payer: 59 | Admitting: Primary Care

## 2018-04-29 ENCOUNTER — Encounter: Payer: Self-pay | Admitting: Primary Care

## 2018-04-29 VITALS — BP 118/70 | HR 88 | Temp 98.8°F | Ht 64.0 in | Wt 177.0 lb

## 2018-04-29 DIAGNOSIS — F331 Major depressive disorder, recurrent, moderate: Secondary | ICD-10-CM | POA: Diagnosis not present

## 2018-04-29 MED ORDER — VENLAFAXINE HCL ER 37.5 MG PO CP24: 37.5000 mg | ORAL_CAPSULE | Freq: Every day | ORAL | 1 refills | Status: DC
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
## 2018-04-29 NOTE — Patient Instructions (Addendum)
Stop fluoxetine 40 mg. Start venlafaxine ER 37.5 mg. Take 1 capsule by mouth every morning.  Follow up with Synetta FailAnita as scheduled.  Please schedule a follow up visit with me in 6 weeks, call/message me sooner if needed.  It was a pleasure to see you today!
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
## 2018-04-30 ENCOUNTER — Ambulatory Visit (INDEPENDENT_AMBULATORY_CARE_PROVIDER_SITE_OTHER): Payer: 59 | Admitting: Psychology

## 2018-04-30 DIAGNOSIS — F331 Major depressive disorder, recurrent, moderate: Secondary | ICD-10-CM

## 2018-05-07 ENCOUNTER — Ambulatory Visit: Payer: 59 | Admitting: Psychology

## 2018-06-04 MED ORDER — ADALIMUMAB PEN CITRATE FREE 40 MG/0.4 ML
2 refills | 0 days | Status: CP
Start: 2018-06-04 — End: 2018-06-12

## 2018-06-11 ENCOUNTER — Ambulatory Visit: Payer: 59 | Admitting: Primary Care

## 2018-06-12 ENCOUNTER — Encounter: Admit: 2018-06-12 | Discharge: 2018-06-13 | Payer: BLUE CROSS/BLUE SHIELD

## 2018-06-12 DIAGNOSIS — L732 Hidradenitis suppurativa: Secondary | ICD-10-CM | POA: Diagnosis not present

## 2018-06-12 DIAGNOSIS — Z79899 Other long term (current) drug therapy: Secondary | ICD-10-CM | POA: Diagnosis not present

## 2018-06-12 DIAGNOSIS — K603 Anal fistula: Secondary | ICD-10-CM | POA: Diagnosis not present

## 2018-06-12 MED ORDER — METHOTREXATE SODIUM 2.5 MG TABLET
ORAL_TABLET | ORAL | 1 refills | 0.00000 days | Status: CP
Start: 2018-06-12 — End: 2018-07-12

## 2018-06-12 MED ORDER — ADALIMUMAB PEN CITRATE FREE 40 MG/0.4 ML
SUBCUTANEOUS | 3 refills | 0.00000 days | Status: CP
Start: 2018-06-12 — End: 2018-06-14

## 2018-06-12 MED ORDER — FOLIC ACID 1 MG TABLET
ORAL_TABLET | Freq: Every day | ORAL | 3 refills | 0.00000 days | Status: CP
Start: 2018-06-12 — End: 2018-07-03

## 2018-06-14 MED ORDER — ADALIMUMAB PEN CITRATE FREE 40 MG/0.4 ML
SUBCUTANEOUS | 3 refills | 0.00000 days | Status: CP
Start: 2018-06-14 — End: 2018-10-22

## 2018-06-30 ENCOUNTER — Other Ambulatory Visit: Payer: Self-pay | Admitting: Primary Care

## 2018-06-30 DIAGNOSIS — F331 Major depressive disorder, recurrent, moderate: Secondary | ICD-10-CM

## 2018-07-02 NOTE — Telephone Encounter (Signed)
Message left for patient to return my call.  

## 2018-07-04 MED ORDER — FOLIC ACID 1 MG TABLET
ORAL_TABLET | Freq: Every day | ORAL | 3 refills | 0.00000 days | Status: CP
Start: 2018-07-04 — End: 2018-09-30

## 2018-07-05 ENCOUNTER — Encounter: Payer: Self-pay | Admitting: Primary Care

## 2018-07-05 ENCOUNTER — Ambulatory Visit (INDEPENDENT_AMBULATORY_CARE_PROVIDER_SITE_OTHER): Payer: 59 | Admitting: Primary Care

## 2018-07-05 VITALS — BP 118/70 | HR 78 | Temp 98.2°F | Ht 64.0 in | Wt 189.8 lb

## 2018-07-05 DIAGNOSIS — F411 Generalized anxiety disorder: Secondary | ICD-10-CM | POA: Insufficient documentation

## 2018-07-05 DIAGNOSIS — F331 Major depressive disorder, recurrent, moderate: Secondary | ICD-10-CM | POA: Diagnosis not present

## 2018-07-05 MED ORDER — BUSPIRONE HCL 7.5 MG PO TABS: ORAL_TABLET | ORAL | 0 refills | Status: DC

## 2018-07-05 NOTE — Patient Instructions (Addendum)
Start buspirone 7.5 mg tablets for anxiety/irritability. Take 1 tablet by mouth once to twice daily.  Continue fluoxetine 40 mg for anxiety and depression.  Follow up with Synetta Fail as scheduled.  Please keep me updated via My Chart. It was a pleasure to see you today!

## 2018-07-05 NOTE — Assessment & Plan Note (Signed)
Felt better on fluoxetine rather than venlafaxine and resumed on her own. Agree to continue fluoxetine 40 mg daily.   Discussed options for treatment of other symptoms, will add in Buspar 1-2 times daily for irritability/anxiety.  She will update in 3-4 weeks.   Follow up with therapy as scheduled.

## 2018-07-05 NOTE — Progress Notes (Signed)
Subjective:    Patient ID: Tracie [name], female    DOB: 13-Apr-1986, 32 y.o.   MRN: [id number]  HPI  Ms. Tracie [name] is a 32 year old female who presents today to discuss depression.  She is currently prescribed venlafaxine er 37.5 mg capsules. She was previously managed on fluoxetine 40 mg which was switched to venlafaxine in July 2019. She reported good and bad days on fluoxetine and felt it was more effective in the beginning.   Since her last visit she stopped her venlafaxine as she didn't feel like it was working, also felt angry, short tempered. She stopped venlafaxine around 2-3 weeks ago and resumed her fluoxetine. She is working to improve her relationship with her husband, has been open and honest with her feelings. She and her husband will be meeting with her therapist together in October. She is planning on moving back to [place] to be closer to her family.   She is taking her fluoxetine 40 mg daily and is doing better but is wanting to know if she can increase the dose. She continues to experience symptoms of agitation, irritability, and nervousness around 4 pm when her husband comes home and during her evening routine with the kids. She denies SI/HI.   Review of Systems  Respiratory: Negative for shortness of breath.   Cardiovascular: Negative for chest pain.  Neurological: Negative for headaches.  Psychiatric/Behavioral: Negative for suicidal ideas.       See HPI       Past Medical History:  Diagnosis Date  . Chicken pox   . Endometriosis 2013  . 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 Medical history non-contributory   . Moderate episode of recurrent major depressive disorder (HCC) 03/29/2017  . Sleep pattern disturbance 06/07/2016  . Vulval hidradenitis suppurativa 09/08/2015  . Vulvar abscess 12/29/2015     Social History   Socioeconomic History  . Marital status: Married    Spouse name: Not on file  . Number of children: Not on file  . Years of education: Not on  file  . Highest education level: Not on file  Occupational History  . Not on file  Social Needs  . Financial resource strain: Not on file  . Food insecurity:    Worry: Not on file    Inability: Not on file  . Transportation needs:    Medical: Not on file    Non-medical: Not on file  Tobacco Use  . Smoking status: Never Smoker  . Smokeless tobacco: Never Used  Substance and Sexual Activity  . Alcohol use: Yes  . Drug use: No  . Sexual activity: Not on file  Lifestyle  . Physical activity:    Days per week: Not on file    Minutes per session: Not on file  . Stress: Not on file  Relationships  . Social connections:    Talks on phone: Not on file    Gets together: Not on file    Attends religious service: Not on file    Active member of club or organization: Not on file    Attends meetings of clubs or organizations: Not on file    Relationship status: Not on file  . Intimate partner violence:    Fear of current or ex partner: Not on file    Emotionally abused: Not on file    Physically abused: Not on file    Forced sexual activity: Not on file  Other Topics Concern  . Not on file  Social  History Narrative   Married.   2 children.    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   Enjoys painting, spending time with family.    Past Surgical History:  Procedure Laterality Date  . DIAGNOSTIC LAPAROSCOPY    . DILATION AND CURETTAGE OF UTERUS    . TONSILLECTOMY    . TONSILLECTOMY AND ADENOIDECTOMY  1994  . VULVA /PERINEUM BIOPSY Left 01/31/2016   Procedure: VULVAR BIOPSY;  Surgeon: Suzy Bouchard, MD;  Location: [facility] ORS;  Service: Gynecology;  Laterality: Left;    Family History  Problem Relation Age of Onset  . Breast cancer Maternal Grandmother   . Skin cancer Maternal Grandmother   . Lung cancer Maternal Grandfather   . Prostate cancer Paternal Grandfather     Allergies  Allergen Reactions  . Cephalexin Swelling  . Hydrocodone-Acetaminophen  Nausea Only  . Hydrocodone-Acetaminophen Nausea Only    Current Outpatient Medications on File Prior to Visit  Medication Sig Dispense Refill  . ADALIMUMAB [place] Maintenance dosing 40mg  [place] weekly.  L73.2 hidardenitis.    Marland Kitchen cetirizine (ZYRTEC) 10 MG tablet Take 10 mg by mouth.    Marland Kitchen FLUoxetine (PROZAC) 40 MG capsule Take 40 mg by mouth daily.    . methotrexate (RHEUMATREX) 2.5 MG tablet Take 16 mg by mouth once a week.     No current facility-administered medications on file prior to visit.     BP 118/70   Pulse 78   Temp 98.2 F (36.8 C) (Oral)   Ht 5\' 4"  (1.626 m)   Wt 189 lb 12 oz (86.1 kg)   SpO2 96%   BMI 32.57 kg/m    Objective:   Physical Exam  Constitutional: She appears well-nourished.  Neck: Neck supple.  Cardiovascular: Normal rate and regular rhythm.  Respiratory: Effort normal and breath sounds normal.  Skin: Skin is warm and dry.  Psychiatric: She has a normal mood and affect.           Assessment & Plan:

## 2018-07-05 NOTE — Assessment & Plan Note (Signed)
Doing better on Fluoxetine, will continue with 40 mg daily. Denies SI/HI. Follow up with therapy as scheduled.

## 2018-07-17 ENCOUNTER — Ambulatory Visit: Payer: 59 | Admitting: Psychology

## 2018-07-30 ENCOUNTER — Encounter: Payer: Self-pay | Admitting: Internal Medicine

## 2018-07-30 ENCOUNTER — Ambulatory Visit (INDEPENDENT_AMBULATORY_CARE_PROVIDER_SITE_OTHER): Payer: 59 | Admitting: Internal Medicine

## 2018-07-30 ENCOUNTER — Other Ambulatory Visit: Payer: Self-pay | Admitting: Primary Care

## 2018-07-30 VITALS — BP 120/82 | HR 84 | Temp 98.1°F | Wt 187.0 lb

## 2018-07-30 DIAGNOSIS — Z23 Encounter for immunization: Secondary | ICD-10-CM

## 2018-07-30 DIAGNOSIS — J029 Acute pharyngitis, unspecified: Secondary | ICD-10-CM | POA: Diagnosis not present

## 2018-07-30 DIAGNOSIS — R0982 Postnasal drip: Secondary | ICD-10-CM | POA: Diagnosis not present

## 2018-07-30 DIAGNOSIS — R0989 Other specified symptoms and signs involving the circulatory and respiratory systems: Secondary | ICD-10-CM | POA: Diagnosis not present

## 2018-07-30 DIAGNOSIS — J302 Other seasonal allergic rhinitis: Secondary | ICD-10-CM | POA: Diagnosis not present

## 2018-07-30 DIAGNOSIS — F411 Generalized anxiety disorder: Secondary | ICD-10-CM

## 2018-07-30 LAB — POCT RAPID STREP A (OFFICE): Rapid Strep A Screen: NEGATIVE

## 2018-07-30 MED ORDER — DEXAMETHASONE SODIUM PHOSPHATE 10 MG/ML IJ SOLN
10.0000 mg | Freq: Once | INTRAMUSCULAR | Status: AC
  Administered 2018-07-30: 10 mg via INTRAMUSCULAR

## 2018-07-30 NOTE — Progress Notes (Signed)
Subjective:    Patient ID: Tracie [name], female    DOB: 1985/12/02, 32 y.o.   MRN: [id number]  HPI  Pt presents to the clinic today with c/o runny nose, post nasal drip and sore throat. She reports this started 1 week ago. She is blowing clear mucous out of her nose. She is having some difficulty swallowing. She did notice a Beaulieu spot on her left tonsil. She denies ear pain, nasal congestion, cough or chest congestion. She denies fever, chills or body aches. She has tried Zyrtec, Dayquil with minimal relief. She has no history of allergies or breathing problems. She has not had sick contacts.  Review of Systems      Past Medical History:  Diagnosis Date  . Chicken pox   . Endometriosis 2013  . 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 Medical history non-contributory   . Moderate episode of recurrent major depressive disorder (HCC) 03/29/2017  . Sleep pattern disturbance 06/07/2016  . Vulval hidradenitis suppurativa 09/08/2015  . Vulvar abscess 12/29/2015    Current Outpatient Medications  Medication Sig Dispense Refill  . ADALIMUMAB [place] Maintenance dosing 40mg  [place] weekly.  L73.2 hidardenitis.    . busPIRone (BUSPAR) 7.5 MG tablet Take 1 tablet by mouth once to twice daily for anxiety. 60 tablet 0  . cetirizine (ZYRTEC) 10 MG tablet Take 10 mg by mouth.    Marland Kitchen FLUoxetine (PROZAC) 40 MG capsule Take 40 mg by mouth daily.     No current facility-administered medications for this visit.     Allergies  Allergen Reactions  . Cephalexin Swelling  . Hydrocodone-Acetaminophen Nausea Only  . Hydrocodone-Acetaminophen Nausea Only    Family History  Problem Relation Age of Onset  . Breast cancer Maternal Grandmother   . Skin cancer Maternal Grandmother   . Lung cancer Maternal Grandfather   . Prostate cancer Paternal Grandfather     Social History   Socioeconomic History  . Marital status: Married    Spouse name: Not on file  . Number of children: Not on file  . Years of education: Not  on file  . Highest education level: Not on file  Occupational History  . Not on file  Social Needs  . Financial resource strain: Not on file  . Food insecurity:    Worry: Not on file    Inability: Not on file  . Transportation needs:    Medical: Not on file    Non-medical: Not on file  Tobacco Use  . Smoking status: Never Smoker  . Smokeless tobacco: Never Used  Substance and Sexual Activity  . Alcohol use: Yes  . Drug use: No  . Sexual activity: Not on file  Lifestyle  . Physical activity:    Days per week: Not on file    Minutes per session: Not on file  . Stress: Not on file  Relationships  . Social connections:    Talks on phone: Not on file    Gets together: Not on file    Attends religious service: Not on file    Active member of club or organization: Not on file    Attends meetings of clubs or organizations: Not on file    Relationship status: Not on file  . Intimate partner violence:    Fear of current or ex partner: Not on file    Emotionally abused: Not on file    Physically abused: Not on file    Forced sexual activity: Not on file  Other Topics Concern  .  Not on file  Social History Narrative   Married.   2 children.    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   Enjoys painting, spending time with family.     Constitutional: Denies fever, malaise, fatigue, headache or abrupt weight changes.  HEENT: Pt reports runny nose, sore throat. Denies eye pain, eye redness, ear pain, ringing in the ears, wax buildup, nasal congestion, bloody nose. Respiratory: Denies difficulty breathing, shortness of breath, cough or sputum production.   Cardiovascular: Denies chest pain, chest tightness, palpitations or swelling in the hands or feet.   No other specific complaints in a complete review of systems (except as listed in HPI above).  Objective:   Physical Exam   BP 120/82   Pulse 84   Temp 98.1 F (36.7 C) (Oral)   Wt 187 lb (84.8 kg)   SpO2 97%    BMI 32.10 kg/m  Wt Readings from Last 3 Encounters:  07/30/18 187 lb (84.8 kg)  07/05/18 189 lb 12 oz (86.1 kg)  04/29/18 177 lb (80.3 kg)    General: Appears her stated age, well developed, well nourished in NAD. HEENT: Head: normal shape and size, no sinus tenderness noted; Nose: mucosa boggy and moist, turbinates swollen; Throat/Mouth: Teeth present, mucosa erythematous and moist, + PND, no exudate, lesions or ulcerations noted.  Neck:  No adenopathy noted. Cardiovascular: Normal rate and rhythm. S1,S2 noted.  No murmur, rubs or gallops noted.  Pulmonary/Chest: Normal effort and positive vesicular breath sounds. No respiratory distress. No wheezes, rales or ronchi noted.  Neurological: Alert and oriented.    BMET    Component Value Date/Time   NA 137 07/21/2017 1631   K 3.6 07/21/2017 1631   CL 104 07/21/2017 1631   CO2 23 07/21/2017 1631   GLUCOSE 104 (H) 07/21/2017 1631   BUN 12 07/21/2017 1631   CREATININE 0.65 07/21/2017 1631   CALCIUM 9.3 07/21/2017 1631   GFRNONAA >60 07/21/2017 1631   GFRAA >60 07/21/2017 1631    Lipid Panel  No results found for: CHOL, TRIG, HDL, CHOLHDL, VLDL, LDLCALC  CBC    Component Value Date/Time   WBC 10.0 07/21/2017 1631   RBC 4.85 07/21/2017 1631   HGB 14.0 07/21/2017 1631   HCT 41.2 07/21/2017 1631   PLT 215 07/21/2017 1631   MCV 85.0 07/21/2017 1631   MCH 28.9 07/21/2017 1631   MCHC 34.0 07/21/2017 1631   RDW 13.3 07/21/2017 1631    Hgb A1C No results found for: HGBA1C         Assessment & Plan:   Runny Nose, PND, Sore Throat:  Likely allergies RST: negative Decadron 10 mg IM today Continue Zyrtec OTC Add in Flonase OTC  Return precautions discussed Nicki Reaper, NP

## 2018-07-30 NOTE — Addendum Note (Signed)
Addended by: Roena Malady on: 07/30/2018 04:53 PM   Modules accepted: Orders

## 2018-07-30 NOTE — Patient Instructions (Signed)

## 2018-07-31 ENCOUNTER — Other Ambulatory Visit: Payer: Self-pay | Admitting: Primary Care

## 2018-07-31 DIAGNOSIS — F411 Generalized anxiety disorder: Secondary | ICD-10-CM

## 2018-07-31 NOTE — Telephone Encounter (Signed)
Ok to change to 90 days

## 2018-07-31 NOTE — Telephone Encounter (Signed)
Sent patient mychart message

## 2018-08-01 NOTE — Telephone Encounter (Signed)
Will you please call patient? She hasn't answered my my chart message. See message.

## 2018-08-02 NOTE — Telephone Encounter (Signed)
Message left for patient to return my call.  

## 2018-08-06 NOTE — Telephone Encounter (Signed)
Message left for patient to return my call.  

## 2018-08-13 MED ORDER — METHOTREXATE SODIUM 2.5 MG TABLET
ORAL_TABLET | 0 refills | 0.00000 days | Status: CP
Start: 2018-08-13 — End: ?

## 2018-09-30 MED ORDER — FOLIC ACID 1 MG TABLET
ORAL_TABLET | Freq: Every day | ORAL | 3 refills | 0.00000 days | Status: CP
Start: 2018-09-30 — End: ?

## 2018-10-15 MED ORDER — TRIAMCINOLONE ACETONIDE 0.1 % TOPICAL CREAM
Freq: Two times a day (BID) | TOPICAL | 0 refills | 0.00000 days | Status: CP
Start: 2018-10-15 — End: ?

## 2018-10-22 ENCOUNTER — Encounter: Payer: Self-pay | Admitting: Family Medicine

## 2018-10-22 ENCOUNTER — Ambulatory Visit (INDEPENDENT_AMBULATORY_CARE_PROVIDER_SITE_OTHER): Payer: 59 | Admitting: Family Medicine

## 2018-10-22 VITALS — BP 104/76 | HR 98 | Temp 98.8°F | Ht 64.0 in | Wt 191.8 lb

## 2018-10-22 DIAGNOSIS — R599 Enlarged lymph nodes, unspecified: Secondary | ICD-10-CM

## 2018-10-22 DIAGNOSIS — R509 Fever, unspecified: Secondary | ICD-10-CM | POA: Diagnosis not present

## 2018-10-22 DIAGNOSIS — J020 Streptococcal pharyngitis: Secondary | ICD-10-CM | POA: Diagnosis not present

## 2018-10-22 LAB — POC INFLUENZA A&B (BINAX/QUICKVUE)
INFLUENZA A, POC: NEGATIVE
INFLUENZA B, POC: NEGATIVE

## 2018-10-22 LAB — POCT RAPID STREP A (OFFICE): RAPID STREP A SCREEN: POSITIVE — AB

## 2018-10-22 MED ORDER — LIDOCAINE VISCOUS HCL 2 % MT SOLN: OROMUCOSAL | 0 refills | Status: DC

## 2018-10-22 MED ORDER — AZITHROMYCIN 250 MG PO TABS: ORAL_TABLET | ORAL | 0 refills | Status: DC

## 2018-10-22 MED ORDER — ADALIMUMAB PEN CITRATE FREE 40 MG/0.4 ML
3 refills | 0 days | Status: CP
Start: 2018-10-22 — End: 2019-01-28

## 2018-10-22 NOTE — Patient Instructions (Addendum)
Rest and fluids.  Start zithromax.  Use lidocaine if needed.  Gargle with salt water.  Take ibuprofen with food for pain.  Take care.  Glad to see you.

## 2018-10-22 NOTE — Progress Notes (Signed)
Sx started about 1 week ago with cough/runny nose.  Sx worse in the last 4-5 days.  Now with B ear pain.  ST.  Better in the day with cold meds.  Worse at night. Pain with swallowing.  Body aches.  Some cough, from post nasal gtt and not originating in the chest.   Meds, vitals, and allergies reviewed.   ROS: Per HPI unless specifically indicated in ROS section   GEN: nad, alert and oriented HEENT: mucous membranes moist, tm w/o erythema, nasal exam w/o erythema, clear discharge noted,  OP with cobblestoning NECK: supple w/ tender LA bilaterally CV: rrr.   PULM: ctab, no inc wob EXT: no edema SKIN: no acute rash  Flu neg.  Strep positive.

## 2018-10-23 DIAGNOSIS — J020 Streptococcal pharyngitis: Secondary | ICD-10-CM | POA: Insufficient documentation

## 2018-10-23 NOTE — Assessment & Plan Note (Signed)
Flu neg.  Strep positive.   Rest and fluids.  Start zithromax.  Use lidocaine if needed.  Gargle with salt water.  Take ibuprofen with food for pain.  She agrees.  Okay for outpatient follow-up.

## 2018-11-07 ENCOUNTER — Ambulatory Visit (INDEPENDENT_AMBULATORY_CARE_PROVIDER_SITE_OTHER): Payer: 59 | Admitting: Primary Care

## 2018-11-07 ENCOUNTER — Encounter: Payer: Self-pay | Admitting: Primary Care

## 2018-11-07 VITALS — BP 110/74 | HR 88 | Temp 98.4°F | Ht 64.0 in | Wt 193.5 lb

## 2018-11-07 DIAGNOSIS — F331 Major depressive disorder, recurrent, moderate: Secondary | ICD-10-CM | POA: Diagnosis not present

## 2018-11-07 DIAGNOSIS — J029 Acute pharyngitis, unspecified: Secondary | ICD-10-CM | POA: Diagnosis not present

## 2018-11-07 DIAGNOSIS — J020 Streptococcal pharyngitis: Secondary | ICD-10-CM

## 2018-11-07 LAB — POCT RAPID STREP A (OFFICE): RAPID STREP A SCREEN: NEGATIVE

## 2018-11-07 MED ORDER — BUPROPION HCL ER (XL) 150 MG PO TB24: 150.0000 mg | ORAL_TABLET | Freq: Every day | ORAL | 1 refills | Status: DC

## 2018-11-07 NOTE — Assessment & Plan Note (Addendum)
Resolved.  Exam unremarkable.  Rapid strep today negative. Suspect symptoms from postnasal drip secondary to allergies. Switch from Zyrtec to Xyzal, continue Flonase. Tylenol or ibuprofen as needed for sore throat.

## 2018-11-07 NOTE — Assessment & Plan Note (Signed)
Symptoms of depression have been waxing and waning over time. Is going through a lot with her current marriage, filing for separation. She cannot tolerate venlafaxine, did not feel improved with Zoloft.  She has found some benefit with fluoxetine so we will continue her at 40 mg as currently prescribed.  Add in Wellbutrin XL 150 mg daily.  Strongly advised she get reconnected with therapy, she agrees and referral was placed today. We also discussed the potential for psychiatry evaluation, she agrees and referral was placed.  We will plan to see her back in 4 to 6 weeks for follow-up or with the psychiatrist if able. She has no plan for suicidal attempt and contracts for safety today.

## 2018-11-07 NOTE — Progress Notes (Signed)
Subjective:    Patient ID: Tracie [name], female    DOB: March 09, 1986, 33 y.o.   MRN: [id number]  HPI  Tracie [name] is a 33 year old female who presents today for follow up of strep pharyngitis and depression.   1) Sore throat: She was last evaluated on 10/23/18 by Dr. Para Marchuncan. Diagnosed with strep pharyngitis and treated with azithromycin and viscous lidocaine.   Since her last visit she felt resolved but her symptoms returned 2-3 days ago. Symptoms include sore throat, left ear fullness, sore glands, painful swallowing, body aches, fatigue. She denies fevers, exposure to influenza. She's taking Dayquil, Nyquil, "nasal decongestant", Zyrtec.   2) Depression: Currently managed on fluoxetine 40 mg. Lately she's felt very isolated, empty, alone. Has lost friendships, is going through a separation with her husband, husband has had a lot of anger outbursts. She's had some suicidal thoughts. She has no plan.   She is compliant to her fluoxetine 40 mg, uses BuSpar infrequently as needed for anxiety. She could not tolerate sertraline and venlafaxine, feels better with fluoxetine. She would like to see another therapist as her husband doesn't wish to for her to see her prior. She stopped drinking alcohol 9 days ago.  She has never seen a psychiatrist.  Review of Systems  Constitutional: Negative for chills, fatigue and fever.  HENT: Positive for sore throat. Negative for congestion.        Left ear fullness  Respiratory: Negative for shortness of breath.   Cardiovascular: Negative for chest pain.  Musculoskeletal: Positive for myalgias.  Psychiatric/Behavioral:       See HPI       Past Medical History:  Diagnosis Date  . Chicken pox   . Endometriosis 2013  . 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 Medical history non-contributory   . Moderate episode of recurrent major depressive disorder (HCC) 03/29/2017  . Sleep pattern disturbance 06/07/2016  . Vulval hidradenitis suppurativa 09/08/2015  . Vulvar  abscess 12/29/2015     Social History   Socioeconomic History  . Marital status: Married    Spouse name: Not on file  . Number of children: Not on file  . Years of education: Not on file  . Highest education level: Not on file  Occupational History  . Not on file  Social Needs  . Financial resource strain: Not on file  . Food insecurity:    Worry: Not on file    Inability: Not on file  . Transportation needs:    Medical: Not on file    Non-medical: Not on file  Tobacco Use  . Smoking status: Never Smoker  . Smokeless tobacco: Never Used  Substance and Sexual Activity  . Alcohol use: Yes  . Drug use: No  . Sexual activity: Not on file  Lifestyle  . Physical activity:    Days per week: Not on file    Minutes per session: Not on file  . Stress: Not on file  Relationships  . Social connections:    Talks on phone: Not on file    Gets together: Not on file    Attends religious service: Not on file    Active member of club or organization: Not on file    Attends meetings of clubs or organizations: Not on file    Relationship status: Not on file  . Intimate partner violence:    Fear of current or ex partner: Not on file    Emotionally abused: Not on file    Physically  abused: Not on file    Forced sexual activity: Not on file  Other Topics Concern  . Not on file  Social History Narrative   Married.   2 children.    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   Enjoys painting, spending time with family.    Past Surgical History:  Procedure Laterality Date  . DIAGNOSTIC LAPAROSCOPY    . DILATION AND CURETTAGE OF UTERUS    . TONSILLECTOMY    . TONSILLECTOMY AND ADENOIDECTOMY  1994  . VULVA /PERINEUM BIOPSY Left 01/31/2016   Procedure: VULVAR BIOPSY;  Surgeon: Suzy Bouchard, MD;  Location: [facility] ORS;  Service: Gynecology;  Laterality: Left;    Family History  Problem Relation Age of Onset  . Breast cancer Maternal Grandmother   . Skin cancer  Maternal Grandmother   . Lung cancer Maternal Grandfather   . Prostate cancer Paternal Grandfather     Allergies  Allergen Reactions  . Cephalexin Swelling  . Hydrocodone-Acetaminophen Nausea Only  . Hydrocodone-Acetaminophen Nausea Only    Current Outpatient Medications on File Prior to Visit  Medication Sig Dispense Refill  . ADALIMUMAB [place] Maintenance dosing 40mg  [place] weekly.  L73.2 hidardenitis.    . busPIRone (BUSPAR) 7.5 MG tablet TAKE 1 TABLET BY MOUTH ONCE TO TWICE DAILY FOR ANXIETY. 60 tablet 0  . cetirizine (ZYRTEC) 10 MG tablet Take 10 mg by mouth.    Marland Kitchen FLUoxetine (PROZAC) 40 MG capsule Take 40 mg by mouth daily.    Marland Kitchen lidocaine (XYLOCAINE) 2 % solution Swallow 5 ml by mouth every 4-6 hours as needed for throat pain. 100 mL 0   No current facility-administered medications on file prior to visit.     BP 110/74   Pulse 88   Temp 98.4 F (36.9 C) (Oral)   Ht 5\' 4"  (1.626 m)   Wt 193 lb 8 oz (87.8 kg)   SpO2 97%   BMI 33.21 kg/m    Objective:   Physical Exam  Constitutional: She appears well-nourished. She does not appear ill.  HENT:  Right Ear: Tympanic membrane and ear canal normal.  Left Ear: Tympanic membrane and ear canal normal.  Nose: No mucosal edema. Right sinus exhibits no maxillary sinus tenderness and no frontal sinus tenderness. Left sinus exhibits no maxillary sinus tenderness and no frontal sinus tenderness.  Mouth/Throat: Oropharynx is clear and moist.  Neck: Neck supple.  Cardiovascular: Normal rate and regular rhythm.  Respiratory: Effort normal and breath sounds normal. She has no wheezes.  Skin: Skin is warm and dry.  Psychiatric: She has a normal mood and affect.           Assessment & Plan:

## 2018-11-07 NOTE — Patient Instructions (Signed)
Continue fluoxetine (Prozac) 40 mg for depression.  Start bupropion (Wellbutrin) XL 150 mg for depression. Take 1 tablet by mouth every morning.  You will be contacted regarding your referral to psychiatry and therapy.  Please let us know if you have not been contacted within one week.   [facility] [place]. You can try calling for therapy.  Try using Xyzal rather than Zyrtec for allergies. Continue Flonase nasal spray. Ibuprofen or Tylenol as needed for pain.  Schedule a follow up visit with me for 4-6 weeks. If you have an appointment with the psychiatrist then follow up with them instead.  It was a pleasure to see you today!

## 2018-11-08 ENCOUNTER — Other Ambulatory Visit: Payer: Self-pay | Admitting: Psychiatry

## 2018-11-29 ENCOUNTER — Other Ambulatory Visit: Payer: Self-pay | Admitting: Primary Care

## 2018-11-29 DIAGNOSIS — F331 Major depressive disorder, recurrent, moderate: Secondary | ICD-10-CM

## 2018-12-09 ENCOUNTER — Ambulatory Visit: Payer: Self-pay | Admitting: Primary Care

## 2019-01-01 ENCOUNTER — Other Ambulatory Visit: Payer: Self-pay | Admitting: Primary Care

## 2019-01-01 DIAGNOSIS — F331 Major depressive disorder, recurrent, moderate: Secondary | ICD-10-CM

## 2019-01-01 NOTE — Telephone Encounter (Signed)
OK to change? Last prescribed on 11/07/2018 . Last office visit on 11/07/2018. No future appointment

## 2019-01-01 NOTE — Telephone Encounter (Signed)
Please call patient: Did she ever get connected with the psychiatrist? Is she still taking the bupropion (Wellbutrin) 150 mg tablets? Is she still taking fluoxetine?

## 2019-01-02 NOTE — Telephone Encounter (Signed)
Noted, refill sent to pharmacy. 

## 2019-01-02 NOTE — Telephone Encounter (Signed)
Yes, she did get connected with a psychiatrist but schedule is full at the moment. However, she is seeing her therapist every week. Patient is taking bupropion and fluoxetine. She feels the medications are working for her.

## 2019-01-08 ENCOUNTER — Other Ambulatory Visit: Payer: Self-pay | Admitting: Primary Care

## 2019-01-08 DIAGNOSIS — F331 Major depressive disorder, recurrent, moderate: Secondary | ICD-10-CM

## 2019-01-08 NOTE — Telephone Encounter (Signed)
Noted, refill sent to pharmacy. 

## 2019-01-08 NOTE — Telephone Encounter (Signed)
Ok to refill. Last office visit on 11/07/2018. No future appointment

## 2019-01-17 ENCOUNTER — Other Ambulatory Visit: Payer: Self-pay | Admitting: Primary Care

## 2019-01-17 DIAGNOSIS — F331 Major depressive disorder, recurrent, moderate: Secondary | ICD-10-CM

## 2019-01-27 ENCOUNTER — Telehealth: Payer: Self-pay | Admitting: Primary Care

## 2019-01-27 NOTE — Telephone Encounter (Signed)
Called to schedule patient for a virtual visit per Jae Dire regarding [employer]. Left voicemail asking patient to call office.

## 2019-01-27 NOTE — Telephone Encounter (Signed)
Tracie [name], will you set her up for a virtual visit?

## 2019-01-29 MED ORDER — ADALIMUMAB PEN CITRATE FREE 40 MG/0.4 ML
SUBCUTANEOUS | 3 refills | 0.00000 days | Status: CP
Start: 2019-01-29 — End: 2019-02-28

## 2019-02-20 DIAGNOSIS — L2089 Other atopic dermatitis: Secondary | ICD-10-CM | POA: Diagnosis not present

## 2019-03-04 MED ORDER — ADALIMUMAB PEN CITRATE FREE 40 MG/0.4 ML
SUBCUTANEOUS | 3 refills | 0.00000 days | Status: CP
Start: 2019-03-04 — End: 2019-03-10

## 2019-03-10 MED ORDER — ADALIMUMAB PEN CITRATE FREE 40 MG/0.4 ML
SUBCUTANEOUS | 3 refills | 0.00000 days | Status: CP
Start: 2019-03-10 — End: ?

## 2019-06-06 ENCOUNTER — Other Ambulatory Visit: Payer: Self-pay | Admitting: Primary Care

## 2019-06-06 DIAGNOSIS — F331 Major depressive disorder, recurrent, moderate: Secondary | ICD-10-CM

## 2019-06-30 ENCOUNTER — Ambulatory Visit: Admit: 2019-06-30 | Discharge: 2019-07-01 | Payer: BLUE CROSS/BLUE SHIELD

## 2019-06-30 DIAGNOSIS — K603 Anal fistula: Secondary | ICD-10-CM

## 2019-06-30 DIAGNOSIS — L732 Hidradenitis suppurativa: Secondary | ICD-10-CM

## 2019-06-30 DIAGNOSIS — Z79899 Other long term (current) drug therapy: Secondary | ICD-10-CM

## 2019-06-30 MED ORDER — ADALIMUMAB PEN CITRATE FREE 40 MG/0.4 ML
11 refills | 0 days | Status: CP
Start: 2019-06-30 — End: ?

## 2019-07-21 DIAGNOSIS — L089 Local infection of the skin and subcutaneous tissue, unspecified: Principal | ICD-10-CM

## 2019-07-21 MED ORDER — TRAMADOL 50 MG TABLET: 50 mg | tablet | Freq: Four times a day (QID) | 0 refills | 7 days | Status: AC

## 2019-07-21 MED ORDER — DOXYCYCLINE MONOHYDRATE 100 MG CAPSULE: 100 mg | capsule | Freq: Two times a day (BID) | 0 refills | 14 days | Status: AC

## 2019-07-31 ENCOUNTER — Ambulatory Visit (INDEPENDENT_AMBULATORY_CARE_PROVIDER_SITE_OTHER): Payer: 59

## 2019-07-31 DIAGNOSIS — Z23 Encounter for immunization: Secondary | ICD-10-CM | POA: Diagnosis not present

## 2019-09-08 ENCOUNTER — Telehealth: Payer: Self-pay

## 2019-09-08 IMAGING — CT CT RENAL STONE PROTOCOL
3 of 4 series · 11 of 46 positions shown, 16 images · non-contrast
Comparison: None.

CLINICAL DATA: Acute onset of right lower abdominal and right flank
pain 12 hours ago.

EXAM:
CT ABDOMEN AND PELVIS WITHOUT CONTRAST
TECHNIQUE: Multidetector CT imaging of the abdomen and pelvis was performed
following the standard protocol without IV contrast.

[Series 4: lung bases · axial · 0.92mm/px · z∈[-149,-14]mm · 7 of 37 slices shown, 12 images]
[im 5/37  soft-tissue]
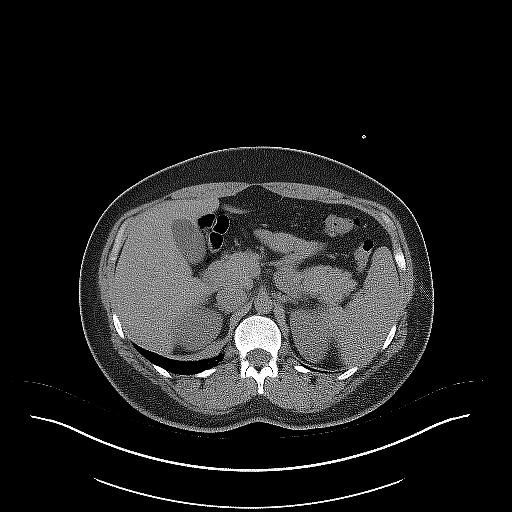
[im 5/37  bone]
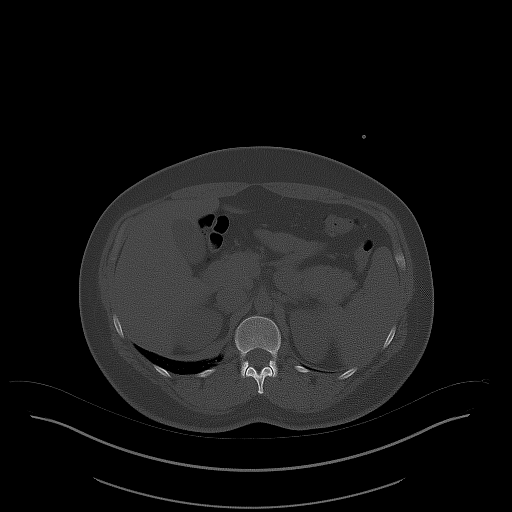
[im 10/37  soft-tissue]
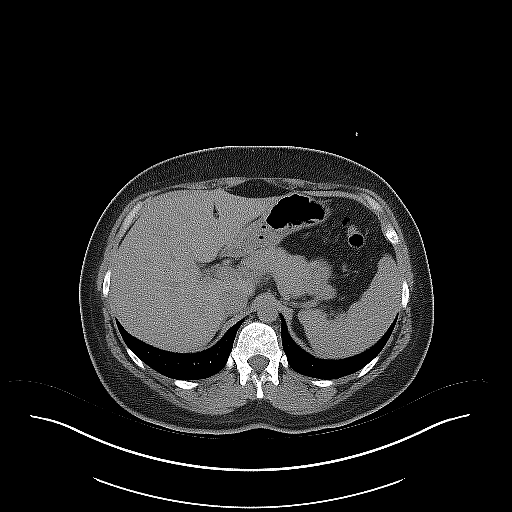
[im 14/37  soft-tissue]
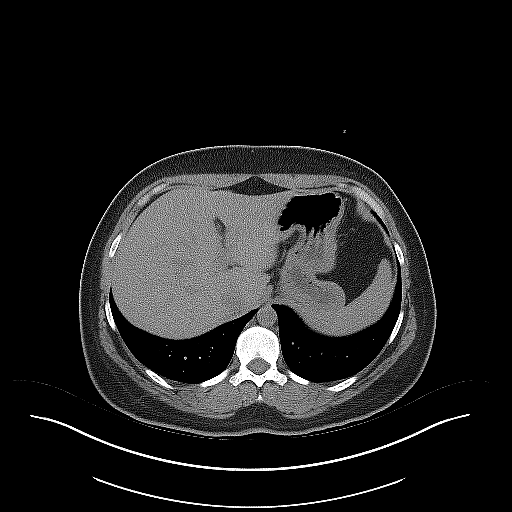
[im 19/37  soft-tissue]
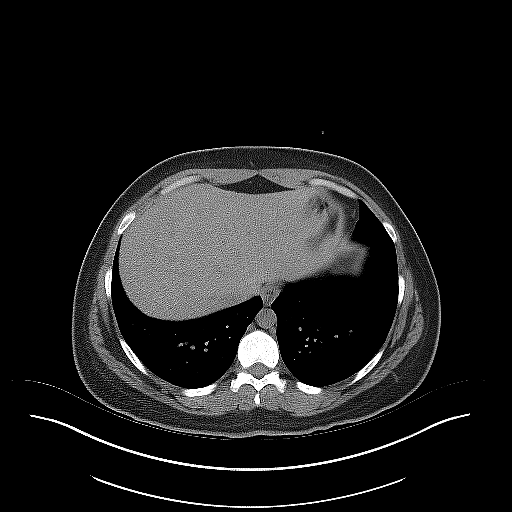
[im 19/37  lung]
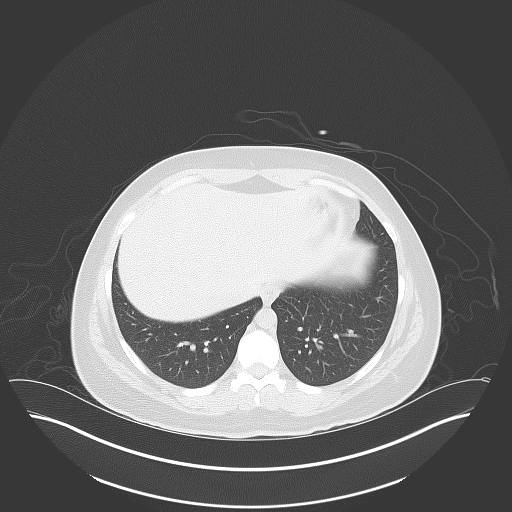
[im 23/37  soft-tissue]
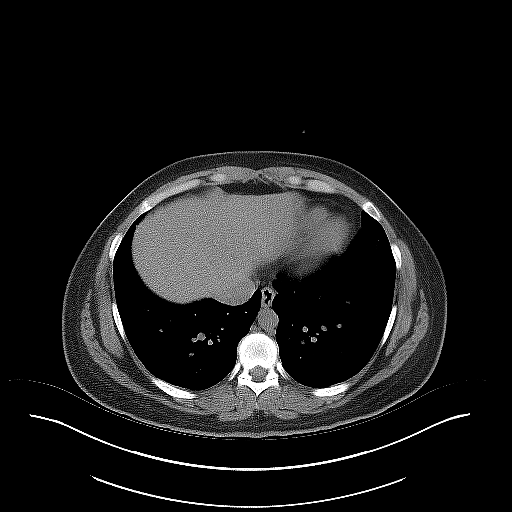
[im 23/37  lung]
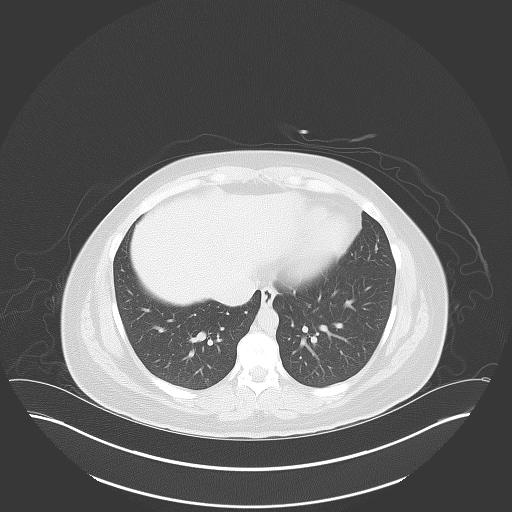
[im 28/37  soft-tissue]
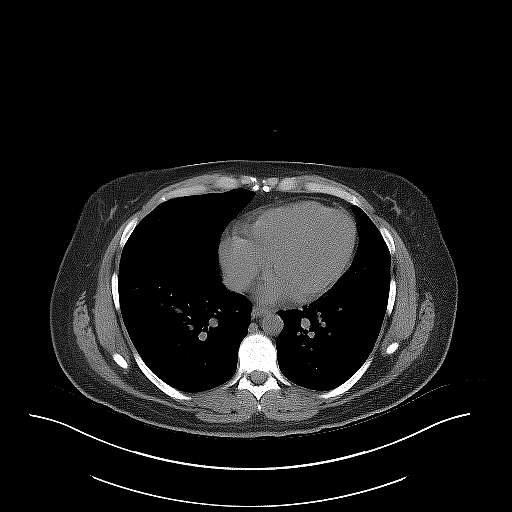
[im 28/37  lung]
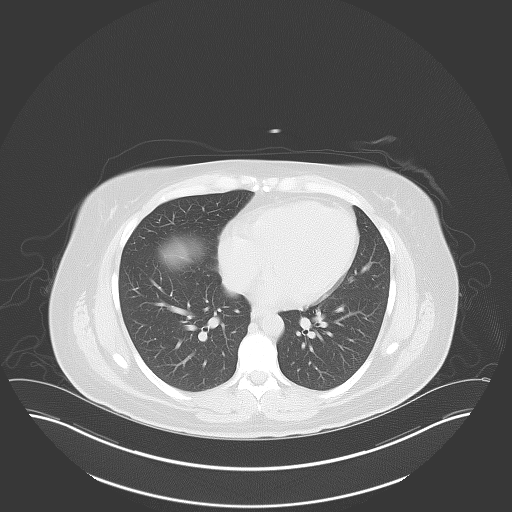
[im 32/37  soft-tissue]
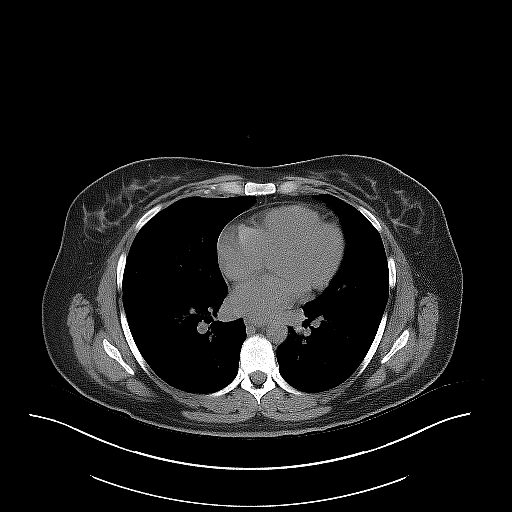
[im 32/37  lung]
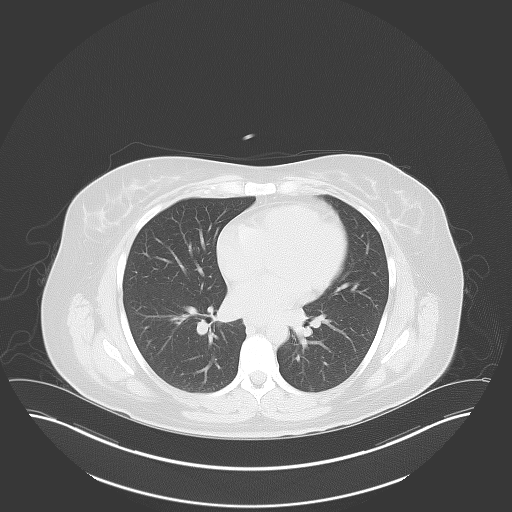

[Series 5: coronal · coronal · 0.77mm/px · 3 of 133 slices shown]
[im 45/133  soft-tissue]
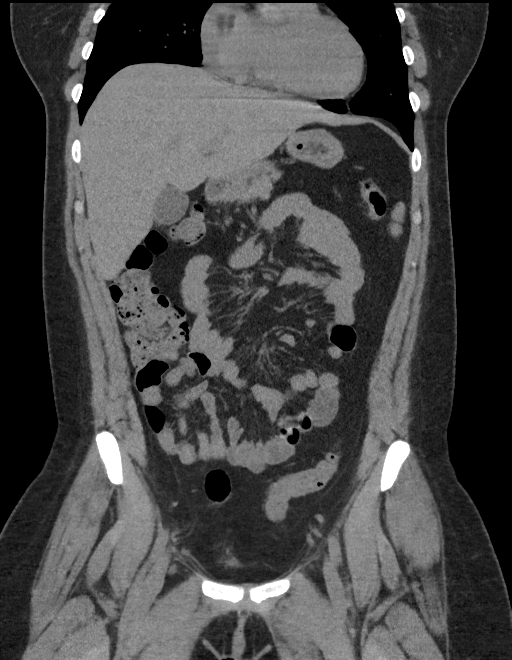
[im 59/133  soft-tissue]
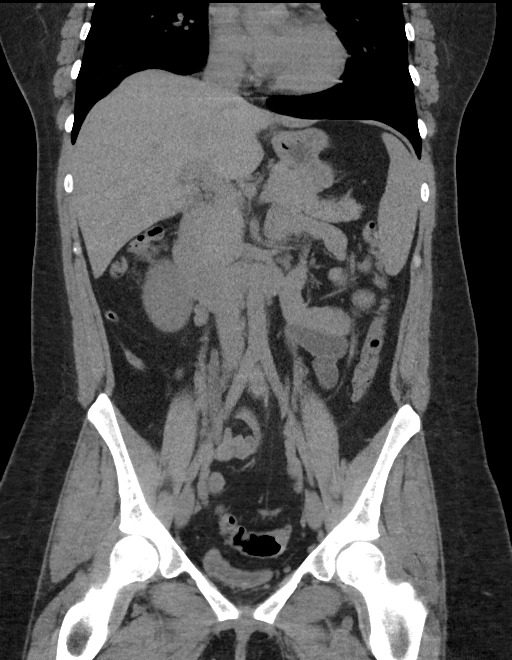
[im 74/133  soft-tissue]
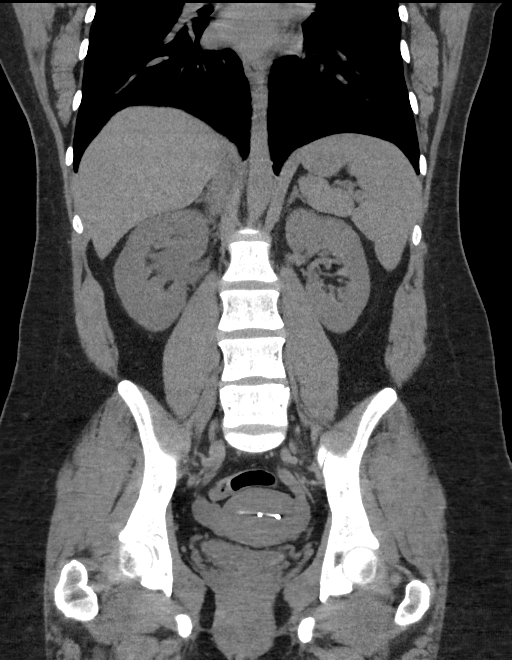

[Series 6: sagittal · sagittal · 0.55mm/px · 1 of 179 slices shown]
[im 60/179  soft-tissue]
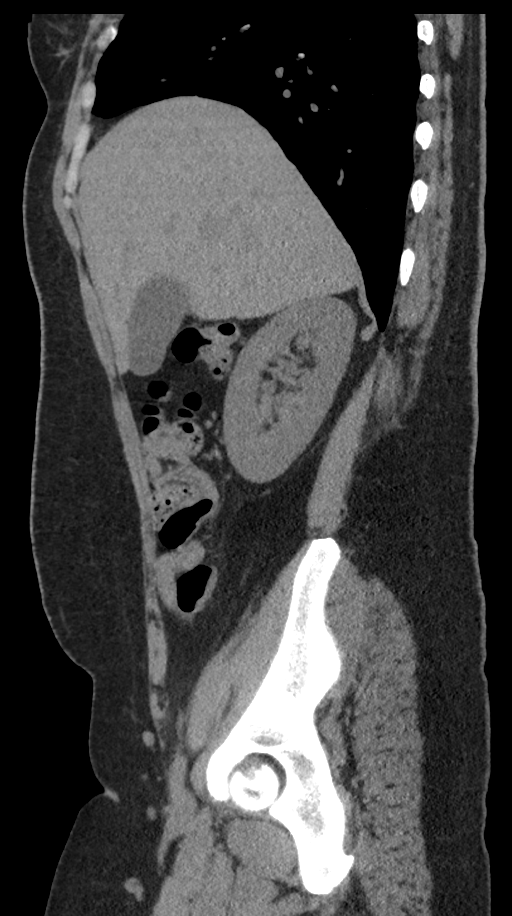

[11 of 46 positions shown; findings below may reference images not displayed]

FINDINGS: Lower chest: No acute abnormality.

Hepatobiliary: No focal liver abnormality is seen. No gallstones,
gallbladder wall thickening, or biliary dilatation.

Pancreas: Unremarkable. No pancreatic ductal dilatation or
surrounding inflammatory changes.

Spleen: Normal in size without focal abnormality.

Adrenals/Urinary Tract: Both adrenals are normal. No significant
renal parenchymal lesions. There is an obstructed 2 mm calculus of
the right ureterovesical junction with marked hydronephrosis and
ureteral dilatation. No other urinary calculi are evident. Left
kidney and ureter are normal. Urinary bladder is unremarkable.

Stomach/Bowel: Stomach is within normal limits. Appendix is normal.
No evidence of bowel wall thickening, distention, or inflammatory
changes.

Vascular/Lymphatic: No significant vascular findings are present. No
enlarged abdominal or pelvic lymph nodes.

Reproductive: Uterus and bilateral adnexa are unremarkable.

Other: Small volume free pelvic fluid. Small fat containing
umbilical hernia

Musculoskeletal: No significant skeletal lesion.
IMPRESSION: Obstructing 2 mm right UVJ calculus with marked hydronephrosis and
ureteral dilatation.

## 2019-09-08 NOTE — Telephone Encounter (Signed)
Per [place] note for pts husband also; pts husband went to [facility] ED and tested positive for covid. Unable to reach pt by phone. FYI to Gentry Fitz NP and Vallarie Mare CMA.

## 2019-09-08 NOTE — Telephone Encounter (Signed)
Noted  

## 2019-09-08 NOTE — Telephone Encounter (Signed)
[place] Night - Client TELEPHONE ADVICE RECORD [facility] Patient Name: Tenille Valone Gender: Female DOB: [birth year] Age: 33 Y 1 M 6 D Return Phone Number: [phone] (Primary) Address: [place] [address] Client [facility] Night - Client Client Site [place] Physician Webb Silversmith - NP Contact Type Call Who Is Calling Patient / Member / Family / Caregiver Call Type Triage / Clinical Return Phone Number [phone] (Primary) Chief Complaint Sore Throat Reason for Call Symptomatic / Request for [place] states his wife and him was exposed and now having symptoms. They are having lost of taste, sore throat, and body aches. Nurse Assessment Nurse: Hassell Done, RN, Mateo Flow Date/Time Eilene Ghazi Time): 09/06/2019 2:49:23 PM Confirm and document reason for call. If symptomatic, describe symptoms. ---Caller states that she was exposed to covid 3-4 days ago, and started having symptoms yesterday. She is having no taste or smell, very hot, no appetite, diarrhea, and is having shortness of breath with exertion, but not with talking. She feels like she has the flu. She is having sinus drainage and a slight cough. She is not having chest pain, but feels intermittent chest pressure. She doesn't currently have a fever. Has the patient had close contact with a person known or suspected to have the novel coronavirus illness OR traveled / lives in area with major community spread (including international travel) in the last 14 days from the onset of symptoms? * If Asymptomatic, screen for exposure and travel within the last 14 days. ---Yes Does the patient have any new or worsening symptoms? ---Yes Will a triage be completed? ---Yes Related visit to physician within the last 2 weeks? ---No Does the PT have any chronic conditions? (i.e. diabetes, asthma, this  includes High risk factors for pregnancy, etc.) ---Yes List chronic conditions. ---Humira for HF and dupixa for eczema Is the patient pregnant or possibly pregnant? (Ask all females between the ages of 51-55) ---No Is this a behavioral health or substance abuse call? ---No Guidelines Guideline Title Affirmed Question Affirmed Notes Nurse Date/Time (Eastern Time) Coronavirus (COVID-19) - Diagnosed or Suspected MILD difficulty breathing (e.g., minimal/no SOB at [place], [place] 09/06/2019 2:52:03 PM PLEASE NOTE: All timestamps contained within this report are represented as [place] Standard Time. CONFIDENTIALTY NOTICE: This fax transmission is intended only for the addressee. It contains information that is legally privileged, confidential or otherwise protected from use or disclosure. If you are not the intended recipient, you are strictly prohibited from reviewing, disclosing, copying using or disseminating any of this information or taking any action in reliance on or regarding this information. If you have received this fax in error, please notify us immediately by telephone so that we can arrange for its return to [place]. Phone: [phone], Toll-Free: [phone], Fax: [phone] Page: 2 of 2 Call Id: [id number] Guidelines Guideline Title Affirmed Question Affirmed Notes Nurse Date/Time Eilene Ghazi Time) rest, SOB with walking, pulse <100) Disp. Time Eilene Ghazi Time) Disposition Final User 09/06/2019 2:28:42 PM Attempt made - line busy Hassell Done, RN, Mateo Flow 09/06/2019 2:54:22 PM Go to ED Now (or PCP triage) Yes Hassell Done, RN, Monika Salk Disagree/Comply Comply Caller Understands Yes PreDisposition Did not know what to do Care Advice Given Per Guideline GO TO ED NOW (OR PCP TRIAGE): * IF NO PCP (PRIMARY CARE PROVIDER) SECOND-LEVEL TRIAGE: You need to be seen within the next hour. Go to the [facility] at _____________ [place] as soon as you can. GENERAL  CARE ADVICE FOR COVID-19  SYMPTOMS: * The treatment is the same whether you have COVID-19, influenza or some other respiratory virus. * Cough: Use cough drops. * Feeling dehydrated: Drink extra liquids. If the air in your home is dry, use a humidifier. * Fever: For fever over 101 F (38.3 C), take acetaminophen every 4-6 hours (Adults 650 mg) OR ibuprofen every 6-8 hours (Adults 400 mg). Before taking any medicine, read all the instructions on the package. Do not take aspirin unless your doctor has prescribed it for you. * Muscle aches, headache, and other pains: Often this comes and goes with the fever. Take acetaminophen every 4-6 hours (Adults 650 mg) OR ibuprofen every 6-8 hours (Adults 400 mg). Before taking any medicine, read all the instructions on the package. * Sore throat: Try throat lozenges, hard candy or warm chicken broth. CARE ADVICE given per CORONAVIRUS (COVID-19) - DIAGNOSED OR SUSPECTED (Adult) guideline. After Care Instructions Given Call Event Type User Date / Time Description Education document email Boneta Lucks 09/06/2019 2:55:42 PM Coronavirus (COVID-19) Diagnosis or Suspected Education document email Boneta Lucks 09/06/2019 2:55:42 PM Coronavirus (COVID-19) Exposure - No Symptoms Education document email Boneta Lucks 09/06/2019 2:55:42 PM Coronavirus (COVID-19) Prevention Education document email Daphine Deutscher, RN, Vikki Ports 09/06/2019 2:55:42 PM COVID-19 Lowella Dandy Education document email Daphine Deutscher, RN, Valerie 09/06/2019 2:55:42 PM What To Do If You Are Sick With COVID-19_English Referrals [facility] ED

## 2019-09-18 ENCOUNTER — Encounter: Payer: Self-pay | Admitting: Family Medicine

## 2019-09-18 ENCOUNTER — Ambulatory Visit (INDEPENDENT_AMBULATORY_CARE_PROVIDER_SITE_OTHER): Payer: 59 | Admitting: Family Medicine

## 2019-09-18 ENCOUNTER — Other Ambulatory Visit: Payer: Self-pay

## 2019-09-18 DIAGNOSIS — H109 Unspecified conjunctivitis: Secondary | ICD-10-CM | POA: Insufficient documentation

## 2019-09-18 DIAGNOSIS — H1032 Unspecified acute conjunctivitis, left eye: Secondary | ICD-10-CM

## 2019-09-18 MED ORDER — ERYTHROMYCIN 5 MG/GM OP OINT: 1.0000 "application " | TOPICAL_OINTMENT | Freq: Four times a day (QID) | OPHTHALMIC | 0 refills | Status: DC

## 2019-09-18 NOTE — Assessment & Plan Note (Signed)
L eye- with injection and yellow drainage No signs of trauma and no vision change or skin changes tx with emycin opth ointment Q 6 hours until better inst to wash hands/linens frequently  Warm clot to wipe away d/c  Get rid of affected makeup and artificial lashes  If no imp in several days-will ref to opthy  Update if not starting to improve in a week or if worsening  -esp if worse pain or any vision change or photophobia

## 2019-09-18 NOTE — Patient Instructions (Signed)
Try not to touch eyes/face   You need to get rid of recently used lashes or eye makeup   No eye makeup until this is better   Use the erythromycin eye ointment as directed in the affected eye   Watch for: Change in vision  Light sensitivity  Redness or swelling of skin around the eye   Also cold symptoms   If no improvement in 2-3 days let us know    Wash linens/pillow cases frequently  Also hands

## 2019-09-18 NOTE — Progress Notes (Signed)
Subjective:    Patient ID: Tracie [name], female    DOB: 1986-03-20, 33 y.o.   MRN: [id number]  This visit occurred during the SARS-CoV-2 public health emergency.  Safety protocols were in place, including screening questions prior to the visit, additional usage of staff PPE, and extensive cleaning of exam room while observing appropriate contact time as indicated for disinfecting solutions.    HPI  Pt presents with ? Pink eye in L eye  Started last night   Noted it when she used cerave makeup remover - it burned (unusual for her)  New pack She wears makeup and false lashes (she generally throws them away after a use)  Keeps her brushes clean   No known contacts with pink eye  Has 2 kids at home-no symptoms   No trauma or foreign body   L eye is watery  Crusty this am  Red  Sensitive  Not itchy  Vision is affected by d/c only   No new headache  No cold symptoms   Not prone to eye problems   She takes dupixent- this does cause dry eye  Does not wear contact lenses    Works as a Producer, television/film/videohair dresser  Is exp to chemicals /fumes  Patient Active Problem List   Diagnosis Date Noted  . Strep throat 10/23/2018  . GAD (generalized anxiety disorder) 07/05/2018  . Moderate episode of recurrent major depressive disorder (HCC) 03/29/2017  . 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 Frequent headaches 06/07/2016  . Sleep pattern disturbance 06/07/2016  . Vulvar abscess 12/29/2015  . Vulval hidradenitis suppurativa 09/08/2015  . Obesity affecting pregnancy in third trimester, antepartum 07/12/2015  . Supervision of normal pregnancy in third trimester 06/15/2015   Past Medical History:  Diagnosis Date  . Chicken pox   . Endometriosis 2013  . 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 Medical history non-contributory   . Moderate episode of recurrent major depressive disorder (HCC) 03/29/2017  . Sleep pattern disturbance 06/07/2016  . Vulval hidradenitis suppurativa 09/08/2015  . Vulvar abscess  12/29/2015   Past Surgical History:  Procedure Laterality Date  . DIAGNOSTIC LAPAROSCOPY    . DILATION AND CURETTAGE OF UTERUS    . TONSILLECTOMY    . TONSILLECTOMY AND ADENOIDECTOMY  1994  . VULVA /PERINEUM BIOPSY Left 01/31/2016   Procedure: VULVAR BIOPSY;  Surgeon: Suzy Bouchardhomas J Schermerhorn, MD;  Location: [facility];  Service: Gynecology;  Laterality: Left;   Social History   Tobacco Use  . Smoking status: Never Smoker  . Smokeless tobacco: Never Used  Substance Use Topics  . Alcohol use: Yes  . Drug use: No   Family History  Problem Relation Age of Onset  . Breast cancer Maternal Grandmother   . Skin cancer Maternal Grandmother   . Lung cancer Maternal Grandfather   . Prostate cancer Paternal Grandfather    Allergies  Allergen Reactions  . Cephalexin Swelling  . Hydrocodone-Acetaminophen Nausea Only  . Hydrocodone-Acetaminophen Nausea Only   Current Outpatient Medications on File Prior to Visit  Medication Sig Dispense Refill  . ADALIMUMAB [place] Maintenance dosing 40mg  [place] weekly.  L73.2 hidardenitis.    Marland Kitchen. buPROPion (WELLBUTRIN XL) 150 MG 24 hr tablet TAKE 1 TABLET (150 MG TOTAL) BY MOUTH DAILY. FOR DEPRESSION 90 tablet 1  . cetirizine (ZYRTEC) 10 MG tablet Take 10 mg by mouth.    . DUPIXENT 300 MG/2ML prefilled syringe 1 injection every other week    . FLUoxetine (PROZAC) 40 MG capsule TAKE 1 CAPSULE BY MOUTH EVERY  DAY 30 capsule 2  . lidocaine (XYLOCAINE) 2 % solution Swallow 5 ml by mouth every 4-6 hours as needed for throat pain. 100 mL 0   No current facility-administered medications on file prior to visit.     Review of Systems  Constitutional: Negative for activity change, appetite change, fatigue, fever and unexpected weight change.  HENT: Negative for congestion, ear pain, rhinorrhea, sinus pressure and sore throat.   Eyes: Positive for discharge and redness. Negative for photophobia, pain and visual disturbance.  Respiratory: Negative for cough, shortness of breath  and wheezing.   Cardiovascular: Negative for chest pain and palpitations.  Gastrointestinal: Negative for abdominal pain, blood in stool, constipation and diarrhea.  Endocrine: Negative for polydipsia and polyuria.  Genitourinary: Negative for dysuria, frequency and urgency.  Musculoskeletal: Negative for arthralgias, back pain and myalgias.  Skin: Negative for pallor and rash.  Allergic/Immunologic: Negative for environmental allergies.  Neurological: Negative for dizziness, syncope and headaches.  Hematological: Negative for adenopathy. Does not bruise/bleed easily.  Psychiatric/Behavioral: Negative for decreased concentration and dysphoric mood. The patient is not nervous/anxious.        Objective:   Physical Exam Constitutional:      Appearance: Normal appearance. She is obese. She is not ill-appearing or diaphoretic.  HENT:     Head: Normocephalic and atraumatic.     Right Ear: Tympanic membrane, ear canal and external ear normal.     Left Ear: Tympanic membrane, ear canal and external ear normal.     Nose: Nose normal.     Mouth/Throat:     Mouth: Mucous membranes are moist.     Pharynx: Oropharynx is clear.  Eyes:     General: No scleral icterus.       Right eye: No discharge.        Left eye: Discharge present.    Extraocular Movements: Extraocular movements intact.     Pupils: Pupils are equal, round, and reactive to light.     Comments: Diffuse conjunctival injection of L eye w/o scleral swelling  Symmetric pupils Grossly nl vision  Scant yellow d/c from L eye  No fb seen   No lid/skin swelling or redness  Cardiovascular:     Rate and Rhythm: Normal rate and regular rhythm.     Heart sounds: Normal heart sounds.  Pulmonary:     Effort: Pulmonary effort is normal. No respiratory distress.     Breath sounds: Normal breath sounds. No wheezing or rales.  Musculoskeletal:     Cervical back: Normal range of motion and neck supple.  Lymphadenopathy:     Cervical: No  cervical adenopathy.  Skin:    General: Skin is warm and dry.     Findings: No erythema or rash.  Neurological:     Mental Status: She is alert.     Cranial Nerves: No cranial nerve deficit.  Psychiatric:        Mood and Affect: Mood normal.           Assessment & Plan:   Problem List Items Addressed This Visit      Other   Conjunctivitis    L eye- with injection and yellow drainage No signs of trauma and no vision change or skin changes tx with emycin opth ointment Q 6 hours until better inst to wash hands/linens frequently  Warm clot to wipe away d/c  Get rid of affected makeup and artificial lashes  If no imp in several days-will ref to opthy  Update if  not starting to improve in a week or if worsening  -esp if worse pain or any vision change or photophobia

## 2019-09-22 DIAGNOSIS — K603 Anal fistula: Principal | ICD-10-CM

## 2019-11-27 ENCOUNTER — Other Ambulatory Visit: Payer: Self-pay | Admitting: Primary Care

## 2019-11-27 DIAGNOSIS — F331 Major depressive disorder, recurrent, moderate: Secondary | ICD-10-CM

## 2019-12-22 ENCOUNTER — Other Ambulatory Visit: Payer: Self-pay | Admitting: Primary Care

## 2019-12-22 DIAGNOSIS — F331 Major depressive disorder, recurrent, moderate: Secondary | ICD-10-CM

## 2019-12-24 ENCOUNTER — Other Ambulatory Visit: Payer: Self-pay | Admitting: Primary Care

## 2019-12-24 DIAGNOSIS — F331 Major depressive disorder, recurrent, moderate: Secondary | ICD-10-CM

## 2020-01-09 ENCOUNTER — Ambulatory Visit: Payer: 59 | Attending: Internal Medicine

## 2020-01-09 DIAGNOSIS — Z23 Encounter for immunization: Secondary | ICD-10-CM

## 2020-01-09 NOTE — Progress Notes (Signed)
   Covid-19 Vaccination Clinic  Name:  Tracie [name]    MRN: [id number] DOB: [birth year]  01/09/2020  Ms. Tracie [name] was observed post Covid-19 immunization for 15 minutes without incident. She was provided with Vaccine Information Sheet and instruction to access the V-Safe system.   Tracie [name] was instructed to call 911 with any severe reactions post vaccine: Marland Kitchen Difficulty breathing  . Swelling of face and throat  . A fast heartbeat  . A bad rash all over body  . Dizziness and weakness   Immunizations Administered    Name Date Dose VIS Date Route   Pfizer COVID-19 Vaccine 01/09/2020  3:53 PM 0.3 mL 09/19/2019 Intramuscular   Manufacturer: [employer], [employer]   Lot: [id number]   NDC: [address]-2

## 2020-01-27 ENCOUNTER — Other Ambulatory Visit: Payer: Self-pay | Admitting: Primary Care

## 2020-01-27 DIAGNOSIS — F331 Major depressive disorder, recurrent, moderate: Secondary | ICD-10-CM

## 2020-01-30 ENCOUNTER — Other Ambulatory Visit: Payer: Self-pay | Admitting: Primary Care

## 2020-01-30 DIAGNOSIS — F331 Major depressive disorder, recurrent, moderate: Secondary | ICD-10-CM

## 2020-02-02 ENCOUNTER — Ambulatory Visit: Payer: 59 | Attending: Internal Medicine

## 2020-02-02 DIAGNOSIS — Z23 Encounter for immunization: Secondary | ICD-10-CM

## 2020-02-02 NOTE — Progress Notes (Signed)
   Covid-19 Vaccination Clinic  Name:  Tracie [name]    MRN: [id number] DOB: 09-14-1986  02/02/2020  [name] was observed post Covid-19 immunization for 15 minutes without incident. She was provided with Vaccine Information Sheet and instruction to access the V-Safe system.   [name] was instructed to call 911 with any severe reactions post vaccine: Marland Kitchen Difficulty breathing  . Swelling of face and throat  . A fast heartbeat  . A bad rash all over body  . Dizziness and weakness   Immunizations Administered    Name Date Dose VIS Date Route   Pfizer COVID-19 Vaccine 02/02/2020  2:28 PM 0.3 mL 12/03/2018 Intramuscular   Manufacturer: [employer], [employer]   Lot: [id number]   NDC: [address]-9

## 2020-02-19 ENCOUNTER — Other Ambulatory Visit: Payer: Self-pay

## 2020-02-19 DIAGNOSIS — N201 Calculus of ureter: Secondary | ICD-10-CM | POA: Insufficient documentation

## 2020-02-19 DIAGNOSIS — Z79899 Other long term (current) drug therapy: Secondary | ICD-10-CM | POA: Insufficient documentation

## 2020-02-19 DIAGNOSIS — R1032 Left lower quadrant pain: Secondary | ICD-10-CM | POA: Diagnosis present

## 2020-02-19 LAB — COMPREHENSIVE METABOLIC PANEL
ALT: 18 U/L (ref 0–44)
AST: 21 U/L (ref 15–41)
Albumin: 4.5 g/dL (ref 3.5–5.0)
Alkaline Phosphatase: 55 U/L (ref 38–126)
Anion gap: 9 (ref 5–15)
BUN: 12 mg/dL (ref 6–20)
CO2: 23 mmol/L (ref 22–32)
Calcium: 9.3 mg/dL (ref 8.9–10.3)
Chloride: 107 mmol/L (ref 98–111)
Creatinine, Ser: 0.8 mg/dL (ref 0.44–1.00)
GFR calc Af Amer: >60 mL/min (ref >60)
GFR calc non Af Amer: >60 mL/min (ref >60)
Glucose, Bld: 123 mg/dL — ABNORMAL HIGH (ref 70–99)
Potassium: 3.9 mmol/L (ref 3.5–5.1)
Sodium: 139 mmol/L (ref 135–145)
Total Bilirubin: 0.7 mg/dL (ref 0.3–1.2)
Total Protein: 8.3 g/dL — ABNORMAL HIGH (ref 6.5–8.1)

## 2020-02-19 LAB — LIPASE, BLOOD: Lipase: 31 U/L (ref 11–51)

## 2020-02-19 LAB — CBC
HCT: 42.6 % (ref 36.0–46.0)
Hemoglobin: 15 g/dL (ref 12.0–15.0)
MCH: 30.3 pg (ref 26.0–34.0)
MCHC: 35.2 g/dL (ref 30.0–36.0)
MCV: 86.1 fL (ref 80.0–100.0)
Platelets: 265 10*3/uL (ref 150–400)
RBC: 4.95 MIL/uL (ref 3.87–5.11)
RDW: 12.4 % (ref 11.5–15.5)
WBC: 13.8 10*3/uL — ABNORMAL HIGH (ref 4.0–10.5)
nRBC: 0 % (ref 0.0–0.2)

## 2020-02-19 MED ORDER — ONDANSETRON HCL 4 MG/2ML IJ SOLN
4.0000 mg | Freq: Once | INTRAMUSCULAR | Status: AC
  Administered 2020-02-19: 4 mg via INTRAVENOUS
  Filled 2020-02-19: qty 2

## 2020-02-19 MED ORDER — MORPHINE SULFATE (PF) 4 MG/ML IV SOLN: 4.0000 mg | Freq: Once | INTRAVENOUS | Status: AC

## 2020-02-19 MED ORDER — ONDANSETRON HCL 4 MG/2ML IJ SOLN
INTRAMUSCULAR | Status: AC
  Administered 2020-02-19: 4 mg via INTRAVENOUS
  Filled 2020-02-19: qty 2

## 2020-02-19 MED ORDER — MORPHINE SULFATE (PF) 4 MG/ML IV SOLN
INTRAVENOUS | Status: AC
  Administered 2020-02-19: 4 mg via INTRAVENOUS
  Filled 2020-02-19: qty 1

## 2020-02-19 MED ORDER — ONDANSETRON HCL 4 MG/2ML IJ SOLN: 4.0000 mg | Freq: Once | INTRAMUSCULAR | Status: AC

## 2020-02-19 MED ORDER — FENTANYL CITRATE (PF) 100 MCG/2ML IJ SOLN
50.0000 ug | Freq: Once | INTRAMUSCULAR | Status: AC
  Administered 2020-02-19: 50 ug via INTRAVENOUS
  Filled 2020-02-19: qty 2

## 2020-02-19 NOTE — ED Triage Notes (Signed)
Pt here with abd pain that started yesterday but significantly worse today. Pt crying in triage while holding left side.

## 2020-02-19 NOTE — ED Notes (Signed)
Pt calling out in pain asking for more pain or nausea medications. Pt tearful. RN consulted Dr. Derrill Kay for further orders.

## 2020-02-20 ENCOUNTER — Emergency Department: Payer: 59

## 2020-02-20 ENCOUNTER — Encounter: Payer: Self-pay | Admitting: Emergency Medicine

## 2020-02-20 ENCOUNTER — Emergency Department
Admission: EM | Admit: 2020-02-20 | Discharge: 2020-02-20 | Disposition: A | Discharge status: Home / Self Care | Payer: 59 | Attending: Emergency Medicine | Admitting: Emergency Medicine

## 2020-02-20 DIAGNOSIS — N201 Calculus of ureter: Secondary | ICD-10-CM

## 2020-02-20 HISTORY — DX: Personal history of urinary calculi: Z87.442

## 2020-02-20 LAB — URINALYSIS, COMPLETE (UACMP) WITH MICROSCOPIC
Bilirubin Urine: NEGATIVE
Glucose, UA: NEGATIVE mg/dL
Ketones, ur: 5 mg/dL — AB
Leukocytes,Ua: NEGATIVE
Nitrite: NEGATIVE
Protein, ur: 30 mg/dL — AB
RBC / HPF: >50 RBC/hpf — ABNORMAL HIGH (ref 0–5)
Specific Gravity, Urine: 1.018 (ref 1.005–1.030)
pH: 7 (ref 5.0–8.0)

## 2020-02-20 LAB — POCT PREGNANCY, URINE: Preg Test, Ur: NEGATIVE

## 2020-02-20 MED ORDER — OXYCODONE-ACETAMINOPHEN 5-325 MG PO TABS: 2.0000 | ORAL_TABLET | Freq: Three times a day (TID) | ORAL | 0 refills | Status: DC | PRN

## 2020-02-20 MED ORDER — IOHEXOL 300 MG/ML  SOLN
100.0000 mL | Freq: Once | INTRAMUSCULAR | Status: AC | PRN
  Administered 2020-02-20: 100 mL via INTRAVENOUS

## 2020-02-20 MED ORDER — DOCUSATE SODIUM 100 MG PO CAPS: ORAL_CAPSULE | ORAL | 0 refills | Status: DC

## 2020-02-20 MED ORDER — KETOROLAC TROMETHAMINE 30 MG/ML IJ SOLN
15.0000 mg | Freq: Once | INTRAMUSCULAR | Status: AC
  Administered 2020-02-20: 15 mg via INTRAVENOUS
  Filled 2020-02-20: qty 1

## 2020-02-20 MED ORDER — DROPERIDOL 2.5 MG/ML IJ SOLN
1.2500 mg | Freq: Once | INTRAMUSCULAR | Status: AC
  Administered 2020-02-20: 1.25 mg via INTRAVENOUS
  Filled 2020-02-20: qty 2

## 2020-02-20 MED ORDER — ONDANSETRON 4 MG PO TBDP
ORAL_TABLET | ORAL | 0 refills | Status: DC
Start: 2020-02-20 — End: 2020-03-17

## 2020-02-20 MED ORDER — SODIUM CHLORIDE 0.9 % IV BOLUS
1000.0000 mL | Freq: Once | INTRAVENOUS | Status: AC
  Administered 2020-02-20: 1000 mL via INTRAVENOUS

## 2020-02-20 MED ORDER — DROPERIDOL 2.5 MG/ML IJ SOLN
2.5000 mg | Freq: Once | INTRAMUSCULAR | Status: AC
  Administered 2020-02-20: 2.5 mg via INTRAVENOUS

## 2020-02-20 MED ORDER — MORPHINE SULFATE (PF) 4 MG/ML IV SOLN
4.0000 mg | Freq: Once | INTRAVENOUS | Status: AC
  Administered 2020-02-20: 4 mg via INTRAVENOUS
  Filled 2020-02-20: qty 1

## 2020-02-20 MED ORDER — TAMSULOSIN HCL 0.4 MG PO CAPS
ORAL_CAPSULE | ORAL | 0 refills | Status: DC
Start: 2020-02-20 — End: 2020-03-17

## 2020-02-20 NOTE — ED Notes (Signed)
See triage note, pt reports flank pain and LLQ abdominal pain that started yesterday and worsened this AM. Reports hx kidney stones. +N/V. Denies Diarrhea or fevers.  Pt appears in NAD at this time

## 2020-02-20 NOTE — ED Notes (Signed)
Patient unhooked to get to use restroom and give urine sample.

## 2020-02-20 NOTE — ED Provider Notes (Signed)
[facility] Emergency Department Provider Note  ____________________________________________   First MD Initiated Contact with Patient 02/20/20 0147     (approximate)  I have reviewed the triage vital signs and the nursing notes.   HISTORY  Chief Complaint Abdominal Pain    HPI Tracie [name] is a 34 y.o. female his medical history includes 1 prior episode of kidney stones who presents for evaluation of acute onset and severe episodic pain and her left flank and left lower quadrant.  She reports that the first episode occurred about 36 hours ago and lasted about 15 minutes.  The pain was sharp and stabbing associated with nausea and vomiting but then went away.  It happened again about 12 hours ago and has been relatively constant although it comes and goes in waves.  The pain is now mostly in her left lower quadrant.  Nothing in particular seems to make it better and she cannot find a position of comfort.  She had multiple episodes of nausea and vomiting earlier and had an extended wait in the emergency department lobby due to patient volume.  She said that she had an episode of vomiting and then was able to go to sleep for a while and now she feels better but she is afraid to move because she is afraid the pain will come back.  She reports that she has an IUD and could not be pregnant.  She denies fever although she feels intermittently hot and cold when she is having vomiting episodes.  She denies chest pain, shortness of breath, and dysuria although she has not urinated recently.  She has had decreased oral intake today due to the vomiting.         Past Medical History:  Diagnosis Date  . Chicken pox   . Endometriosis 2013  . History of kidney stones   . 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 Medical history non-contributory   . Moderate episode of recurrent major depressive disorder ([place]) 03/29/2017  . Sleep pattern disturbance 06/07/2016  . Vulval hidradenitis  suppurativa 09/08/2015  . Vulvar abscess 12/29/2015    Patient Active Problem List   Diagnosis Date Noted  . Conjunctivitis 09/18/2019  . Strep throat 10/23/2018  . GAD (generalized anxiety disorder) 07/05/2018  . Moderate episode of recurrent major depressive disorder ([place]) 03/29/2017  . 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 Frequent headaches 06/07/2016  . Sleep pattern disturbance 06/07/2016  . Vulvar abscess 12/29/2015  . Vulval hidradenitis suppurativa 09/08/2015  . Obesity affecting pregnancy in third trimester, antepartum 07/12/2015  . Supervision of normal pregnancy in third trimester 06/15/2015    Past Surgical History:  Procedure Laterality Date  . DIAGNOSTIC LAPAROSCOPY    . DILATION AND CURETTAGE OF UTERUS    . TONSILLECTOMY    . TONSILLECTOMY AND ADENOIDECTOMY  1994  . VULVA /PERINEUM BIOPSY Left 01/31/2016   Procedure: VULVAR BIOPSY;  Surgeon: Boykin Nearing, MD;  Location: [facility] ORS;  Service: Gynecology;  Laterality: Left;    Prior to Admission medications   Medication Sig Start Date End Date Taking? Authorizing Provider  ADALIMUMAB [place] Maintenance dosing 40mg  [place] weekly.  L73.2 hidardenitis. 07/01/17   [provider]  buPROPion (WELLBUTRIN XL) 150 MG 24 hr tablet TAKE 1 TABLET (150 MG TOTAL) BY MOUTH DAILY. FOR DEPRESSION NEED APPOINTMENT FOR ANY MORE REFILLS 01/30/20   Pleas Koch, NP  cetirizine (ZYRTEC) 10 MG tablet Take 10 mg by mouth.    [provider]  docusate sodium (COLACE)  100 MG capsule Take 1 tablet once or twice daily as needed for constipation while taking narcotic pain medicine 02/20/20   Loleta Rose, MD  DUPIXENT 300 MG/2ML prefilled syringe 1 injection every other week 09/16/19   [provider]  erythromycin ophthalmic ointment Place 1 application into the left eye 4 (four) times daily. 09/18/19   Tower, Audrie Gallus, MD  FLUoxetine (PROZAC) 40 MG capsule TAKE 1 CAPSULE (40 MG TOTAL) BY MOUTH DAILY. NEED APPOINTMENT FOR  ANY MORE REFILL 01/30/20   Doreene Nest, NP  lidocaine (XYLOCAINE) 2 % solution Swallow 5 ml by mouth every 4-6 hours as needed for throat pain. 10/22/18   Joaquim Nam, MD  ondansetron (ZOFRAN ODT) 4 MG disintegrating tablet Allow 1-2 tablets to dissolve in your mouth every 8 hours as needed for nausea/vomiting 02/20/20   Loleta Rose, MD  oxyCODONE-acetaminophen (PERCOCET) 5-325 MG tablet Take 2 tablets by mouth every 8 (eight) hours as needed for severe pain. 02/20/20   Loleta Rose, MD  tamsulosin (FLOMAX) 0.4 MG CAPS capsule Take 1 tablet by mouth daily until you pass the kidney stone or no longer have symptoms 02/20/20   Loleta Rose, MD    Allergies Cephalexin, Hydrocodone-acetaminophen, and Hydrocodone-acetaminophen  Family History  Problem Relation Age of Onset  . Breast cancer Maternal Grandmother   . Skin cancer Maternal Grandmother   . Lung cancer Maternal Grandfather   . Prostate cancer Paternal Grandfather     Social History Social History   Tobacco Use  . Smoking status: Never Smoker  . Smokeless tobacco: Never Used  Substance Use Topics  . Alcohol use: Yes  . Drug use: No    Review of Systems Constitutional: No fever/chills Eyes: No visual changes. ENT: No sore throat. Cardiovascular: Denies chest pain. Respiratory: Denies shortness of breath. Gastrointestinal: Left flank and left lower quadrant abdominal pain associated with multiple episodes of nausea and vomiting. Genitourinary: Negative for dysuria.  Decreased urinary output today. Musculoskeletal: Left flank pain. Integumentary: Negative for rash. Neurological: Negative for headaches, focal weakness or numbness.   ____________________________________________   PHYSICAL EXAM:  VITAL SIGNS: ED Triage Vitals [02/19/20 1847]  Enc Vitals Group     BP (!) 148/119     Pulse Rate 97     Resp (!) 22     Temp (!) 97.5 F (36.4 C)     Temp src      SpO2 97 %     Weight 90.7 kg (200 lb)      Height 1.626 m (5\' 4" )     Head Circumference      Peak Flow      Pain Score 10     Pain Loc      Pain Edu?      Excl. in GC?     Constitutional: Alert and oriented.  Eyes: Conjunctivae are normal.  Head: Atraumatic. Nose: No congestion/rhinnorhea. Mouth/Throat: Patient is wearing a mask. Neck: No stridor.  No meningeal signs.   Cardiovascular: Normal rate, regular rhythm. Good peripheral circulation. Grossly normal heart sounds. Respiratory: Normal respiratory effort.  No retractions. Gastrointestinal: Soft and nontender. No distention.  Musculoskeletal: No lower extremity tenderness nor edema. No gross deformities of extremities. Neurologic:  Normal speech and language. No gross focal neurologic deficits are appreciated.  Skin:  Skin is warm, dry and intact. Psychiatric: Mood and affect are normal. Speech and behavior are normal.  ____________________________________________   LABS (all labs ordered are listed, but only abnormal results are displayed)  Labs Reviewed  COMPREHENSIVE METABOLIC PANEL - Abnormal; Notable for the following components:      Result Value   Glucose, Bld 123 (*)    Total Protein 8.3 (*)    All other components within normal limits  CBC - Abnormal; Notable for the following components:   WBC 13.8 (*)    All other components within normal limits  URINALYSIS, COMPLETE (UACMP) WITH MICROSCOPIC - Abnormal; Notable for the following components:   Color, Urine YELLOW (*)    APPearance CLOUDY (*)    Hgb urine dipstick MODERATE (*)    Ketones, ur 5 (*)    Protein, ur 30 (*)    RBC / HPF >50 (*)    Bacteria, UA RARE (*)    All other components within normal limits  LIPASE, BLOOD  POC URINE PREG, ED  POCT PREGNANCY, URINE   ____________________________________________  EKG  No indication for emergent EKG ____________________________________________  RADIOLOGY I, Loleta Rose, personally viewed and evaluated these images (plain radiographs) as  part of my medical decision making, as well as reviewing the written report by the radiologist.  ED MD interpretation:  No acute abnormalities on x-ray.  2-mm left proximal ureteral stone on CT.  Official radiology report(s): DG Abdomen 1 View  Result Date: 02/20/2020 CLINICAL DATA:  Left lower quadrant and left flank pain EXAM: ABDOMEN - 1 VIEW COMPARISON:  None. FINDINGS: The bowel gas pattern is normal. A moderate amount of right colonic stool is present. No radio-opaque calculi or other significant radiographic abnormality are seen. IUD seen within the deep pelvis. IMPRESSION: Negative. Electronically Signed   By: Jonna Clark M.D.   On: 02/20/2020 02:33   CT ABDOMEN PELVIS W CONTRAST  Result Date: 02/20/2020 CLINICAL DATA:  Abdominal pain with nausea and vomiting. EXAM: CT ABDOMEN AND PELVIS WITH CONTRAST TECHNIQUE: Multidetector CT imaging of the abdomen and pelvis was performed using the standard protocol following bolus administration of intravenous contrast. CONTRAST:  OMNIPAQUE IOHEXOL 300 MG/ML  SOLN COMPARISON:  CT abdomen pelvis dated July 21, 2017. FINDINGS: Lower chest: No acute abnormality. Hepatobiliary: No focal liver abnormality is seen. No gallstones, gallbladder wall thickening, or biliary dilatation. Pancreas: Unremarkable. No pancreatic ductal dilatation or surrounding inflammatory changes. Spleen: Normal in size without focal abnormality. Adrenals/Urinary Tract: Adrenal glands and right kidney are unremarkable. 2 mm calculus in the proximal left ureter with resultant mild left hydronephrosis. Mildly decreased enhancement of the left kidney compared to the right. The bladder is unremarkable. Stomach/Bowel: Stomach is within normal limits. Appendix appears normal. No evidence of bowel wall thickening, distention, or inflammatory changes. Vascular/Lymphatic: No significant vascular findings are present. Circumaortic left renal vein. No enlarged abdominal or pelvic lymph  nodes. Reproductive: Uterus and bilateral adnexa are unremarkable. IUD within the endometrial canal. Other: No abdominal wall hernia or abnormality. No abdominopelvic ascites. No pneumoperitoneum. Musculoskeletal: No acute or significant osseous findings. IMPRESSION: 1. 2 mm calculus in the proximal left ureter with resultant mild left hydronephrosis. Delayed left renal enhancement. Electronically Signed   By: Obie Dredge M.D.   On: 02/20/2020 05:06    ____________________________________________   PROCEDURES   Procedure(s) performed (including Critical Care):  Procedures   ____________________________________________   INITIAL IMPRESSION / MDM / ASSESSMENT AND PLAN / ED COURSE  As part of my medical decision making, I reviewed the following data within the electronic MEDICAL RECORD NUMBER Nursing notes reviewed and incorporated, Labs reviewed , Old chart reviewed, Radiograph reviewed , Notes from prior ED visits  and [place] Controlled Substance Database   Differential diagnosis includes, but is not limited to, renal/ureteral stone, ovarian cyst, ovarian torsion, UTI/pyelonephritis, STD/PID/TOA.  The patient's symptoms seem most consistent with ureteral stone.  She is likely in the process of passing it or it is passing into her bladder which accounts for the episodes of pain.  She is currently better and received medication in triage including 2 doses of Zofran 4 mg, 1 dose of morphine 4 mg IV, 1 dose of fentanyl 50 mcg IV.  I have ordered Toradol 15 mg IV and droperidol 1.25 mg IV for intractable nausea; although she is feeling better than she did before and wants to try some oral intake, she still feels nauseated.  I have also ordered 1 L normal saline for her lack of oral intake recently and her mild tachycardia at about 105.  We talked about CT scan versus x-rays and we both agreed to try getting an abdominal x-ray first.  We can always get a CT scan if indicated later.  She knows we also need  to check a urinalysis.  He has no tenderness to palpation of the abdomen and I think ovarian torsion is unlikely but we can consider a pelvic ultrasound if need be later on.       Clinical Course as of Feb 20 620  Fri Feb 20, 2020  0240 Negative abdominal xray  DG Abdomen 1 View [CF]  0307 Patient started to vomiting in the CT scanner and is now screaming in pain.  Ordering morphine 4 mg IV and droperidol 2.5 mg IV.   [CF]  0432 UA notable for hematuria, further suggests ureteral stone.  Awaiting CT results.  Urinalysis, Complete w Microscopic(!) [CF]  [id number] Patient feels much better now and is no longer actively vomiting and the pain has resolved.  I updated her about the 2 mm proximal ureteral stone.  I had my usual and customary kidney stone management discussion and to return precautions were provided.  She is comfortable with the plan to follow-up with urology as an outpatient.   [CF]    Clinical Course User Index [CF] Loleta Rose, MD     ____________________________________________  FINAL CLINICAL IMPRESSION(S) / ED DIAGNOSES  Final diagnoses:  Left ureteral stone     MEDICATIONS GIVEN DURING THIS VISIT:  Medications  fentaNYL (SUBLIMAZE) injection 50 mcg (50 mcg Intravenous Given 02/19/20 1858)  ondansetron (ZOFRAN) injection 4 mg (4 mg Intravenous Given 02/19/20 1858)  morphine 4 MG/ML injection 4 mg (4 mg Intravenous Given 02/19/20 1930)  ondansetron (ZOFRAN) injection 4 mg (4 mg Intravenous Given 02/19/20 1930)  sodium chloride 0.9 % bolus 1,000 mL (1,000 mLs Intravenous New Bag/Given 02/20/20 0215)  droperidol (INAPSINE) 2.5 MG/ML injection 1.25 mg (1.25 mg Intravenous Given 02/20/20 0216)  ketorolac (TORADOL) 30 MG/ML injection 15 mg (15 mg Intravenous Given 02/20/20 0215)  iohexol (OMNIPAQUE) 300 MG/ML solution 100 mL (100 mLs Intravenous Contrast Given 02/20/20 0350)  droperidol (INAPSINE) 2.5 MG/ML injection 2.5 mg (2.5 mg Intravenous Given 02/20/20 0412)  morphine 4  MG/ML injection 4 mg (4 mg Intravenous Given 02/20/20 0411)     ED Discharge Orders         Ordered    oxyCODONE-acetaminophen (PERCOCET) 5-325 MG tablet  Every 8 hours PRN     02/20/20 0619    ondansetron (ZOFRAN ODT) 4 MG disintegrating tablet     02/20/20 0619    tamsulosin (FLOMAX) 0.4 MG CAPS capsule     02/20/20  [id number]    docusate sodium (COLACE) 100 MG capsule     02/20/20 [id number]          *Please note:  Tracie [name] was evaluated in Emergency Department on 02/20/2020 for the symptoms described in the history of present illness. She was evaluated in the context of the global COVID-19 pandemic, which necessitated consideration that the patient might be at risk for infection with the SARS-CoV-2 virus that causes COVID-19. Institutional protocols and algorithms that pertain to the evaluation of patients at risk for COVID-19 are in a state of rapid change based on information released by regulatory bodies including the CDC and federal and state organizations. These policies and algorithms were followed during the patient's care in the ED.  Some ED evaluations and interventions may be delayed as a result of limited staffing during the pandemic.*  Note:  This document was prepared using Dragon voice recognition software and may include unintentional dictation errors.   Loleta RoseForbach, Neidy Guerrieri, MD 02/20/20 [phone]

## 2020-02-20 NOTE — Discharge Instructions (Signed)
You have been seen in the Emergency Department (ED) today for pain caused by kidney stones.  As we have discussed, please drink plenty of fluids.  Please make a follow up appointment with the physician(s) listed elsewhere in this documentation.  You may take pain medication as needed but ONLY as prescribed.  Please also take your prescribed Flomax daily.  We also recommend that you take over-the-counter ibuprofen regularly according to label instructions over the next 5 days.  Take it with meals to minimize stomach discomfort.  Please see your doctor as soon as possible as stones may take 1-3 weeks to pass and you may require additional care or medications.  Do not drink alcohol, drive or participate in any other potentially dangerous activities while taking opiate pain medication as it may make you sleepy. Do not take this medication with any other sedating medications, either prescription or over-the-counter. If you were prescribed Percocet or Vicodin, do not take these with acetaminophen (Tylenol) as it is already contained within these medications.   Take Percocet as needed for severe pain.  This medication is an opiate (or narcotic) pain medication and can be habit forming.  Use it as little as possible to achieve adequate pain control.  Do not use or use it with extreme caution if you have a history of opiate abuse or dependence.  If you are on a pain contract with your primary care doctor or a pain specialist, be sure to let them know you were prescribed this medication today from the [facility].  This medication is intended for your use only - do not give any to anyone else and keep it in a secure place where nobody else, especially children, have access to it.  It will also cause or worsen constipation, so you may want to consider taking an over-the-counter stool softener while you are taking this medication.  Return to the Emergency Department (ED) or call your doctor  if you have any worsening pain, fever, painful urination, are unable to urinate, or develop other symptoms that concern you. 

## 2020-02-25 ENCOUNTER — Other Ambulatory Visit: Payer: Self-pay | Admitting: Primary Care

## 2020-02-25 DIAGNOSIS — F331 Major depressive disorder, recurrent, moderate: Secondary | ICD-10-CM

## 2020-03-16 NOTE — Progress Notes (Signed)
03/17/20  8:22 AM   Tracie [name] September 17, 1986 [id number]  Referring provider: Doreene Nest, NP [address] [place],  [place] [address] Chief Complaint  Patient presents with  . Nephrolithiasis    HPI: Tracie [name] is a 34 y.o. F who presents today for the evaluation and management of ureterolithiasis.   She has a personal history of stones, previously passed punctate stone in 2018.  Composition calcium oxalate.  Visted ED on 02/20/20 c/o left flank and LLQ pain. F/u CT A/P w contrast revealed 2 mm calculus in the proximal left ureter w/ resultant mild left hydronephrosis.   She reports of passing her stone which she brings with her today..   She has a previous hx of stones composed of calcium however this current stone felt different. She tried changing up her diet to a keto diet including eating more animal meat. She reports of drinking less than 3 L of water.   No other upper tract stones on CT scan.  No flank pain or dysuria today.  PMH: Past Medical History:  Diagnosis Date  . Chicken pox   . Endometriosis 2013  . History of kidney stones   . 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 Medical history non-contributory   . Moderate episode of recurrent major depressive disorder (HCC) 03/29/2017  . Sleep pattern disturbance 06/07/2016  . Vulval hidradenitis suppurativa 09/08/2015  . Vulvar abscess 12/29/2015    Surgical History: Past Surgical History:  Procedure Laterality Date  . DIAGNOSTIC LAPAROSCOPY    . DILATION AND CURETTAGE OF UTERUS    . TONSILLECTOMY    . TONSILLECTOMY AND ADENOIDECTOMY  1994  . VULVA /PERINEUM BIOPSY Left 01/31/2016   Procedure: VULVAR BIOPSY;  Surgeon: Suzy Bouchard, MD;  Location: [facility] ORS;  Service: Gynecology;  Laterality: Left;    Home Medications:  Allergies as of 03/17/2020      Reactions   Cephalexin Swelling   Hydrocodone-acetaminophen Nausea Only   Hydrocodone-acetaminophen Nausea Only      Medication List       Accurate  as of March 17, 2020 11:59 PM. If you have any questions, ask your nurse or doctor.        STOP taking these medications   docusate sodium 100 MG capsule Commonly known as: Colace Stopped by: Vanna Scotland, MD   erythromycin ophthalmic ointment Stopped by: Vanna Scotland, MD   lidocaine 2 % solution Commonly known as: XYLOCAINE Stopped by: Vanna Scotland, MD   ondansetron 4 MG disintegrating tablet Commonly known as: Zofran ODT Stopped by: Vanna Scotland, MD   oxyCODONE-acetaminophen 5-325 MG tablet Commonly known as: Percocet Stopped by: Vanna Scotland, MD   tamsulosin 0.4 MG Caps capsule Commonly known as: FLOMAX Stopped by: Vanna Scotland, MD     TAKE these medications   ADALIMUMAB [place] Maintenance dosing 40mg  [place] weekly.  L73.2 hidardenitis.   buPROPion 150 MG 24 hr tablet Commonly known as: WELLBUTRIN XL TAKE 1 TABLET (150 MG TOTAL) BY MOUTH DAILY. FOR DEPRESSION NEED APPOINTMENT FOR ANY MORE REFILLS   cetirizine 10 MG tablet Commonly known as: ZYRTEC Take 10 mg by mouth.   Dupixent 300 MG/2ML prefilled syringe Generic drug: dupilumab 1 injection every other week   FLUoxetine 40 MG capsule Commonly known as: PROZAC TAKE 1 CAPSULE (40 MG TOTAL) BY MOUTH DAILY. NEED APPOINTMENT FOR ANY MORE REFILL       Allergies:  Allergies  Allergen Reactions  . Cephalexin Swelling  . Hydrocodone-Acetaminophen Nausea Only  . Hydrocodone-Acetaminophen  Nausea Only    Family History: Family History  Problem Relation Age of Onset  . Breast cancer Maternal Grandmother   . Skin cancer Maternal Grandmother   . Lung cancer Maternal Grandfather   . Prostate cancer Paternal Grandfather     Social History:  reports that she has never smoked. She has never used smokeless tobacco. She reports current alcohol use. She reports that she does not use drugs.   Physical Exam: BP (!) 138/96   Pulse 84   Constitutional:  Alert and oriented, No acute distress. HEENT: [place] AT,  moist mucus membranes.  Trachea midline, no masses. Cardiovascular: No clubbing, cyanosis, or edema. Respiratory: Normal respiratory effort, no increased work of breathing. Skin: No rashes, bruises or suspicious lesions. Neurologic: Grossly intact, no focal deficits, moving all 4 extremities. Psychiatric: Normal mood and affect.  Laboratory Data:  Lab Results  Component Value Date   CREATININE 0.80 02/19/2020   Pertinent Imaging: Results for orders placed during the hospital encounter of 02/20/20  DG Abdomen 1 View   Narrative CLINICAL DATA:  Left lower quadrant and left flank pain  EXAM: ABDOMEN - 1 VIEW  COMPARISON:  None.  FINDINGS: The bowel gas pattern is normal. A moderate amount of right colonic stool is present. No radio-opaque calculi or other significant radiographic abnormality are seen. IUD seen within the deep pelvis.  IMPRESSION: Negative.   Electronically Signed   By: Prudencio Pair M.D.   On: 02/20/2020 02:33    CLINICAL DATA:  Abdominal pain with nausea and vomiting.  EXAM: CT ABDOMEN AND PELVIS WITH CONTRAST  TECHNIQUE: Multidetector CT imaging of the abdomen and pelvis was performed using the standard protocol following bolus administration of intravenous contrast.  CONTRAST:  160mL OMNIPAQUE IOHEXOL 300 MG/ML  SOLN  COMPARISON:  CT abdomen pelvis dated July 21, 2017.  FINDINGS: Lower chest: No acute abnormality.  Hepatobiliary: No focal liver abnormality is seen. No gallstones, gallbladder wall thickening, or biliary dilatation.  Pancreas: Unremarkable. No pancreatic ductal dilatation or surrounding inflammatory changes.  Spleen: Normal in size without focal abnormality.  Adrenals/Urinary Tract: Adrenal glands and right kidney are unremarkable. 2 mm calculus in the proximal left ureter with resultant mild left hydronephrosis. Mildly decreased enhancement of the left kidney compared to the right. The bladder is  unremarkable.  Stomach/Bowel: Stomach is within normal limits. Appendix appears normal. No evidence of bowel wall thickening, distention, or inflammatory changes.  Vascular/Lymphatic: No significant vascular findings are present. Circumaortic left renal vein. No enlarged abdominal or pelvic lymph nodes.  Reproductive: Uterus and bilateral adnexa are unremarkable. IUD within the endometrial canal.  Other: No abdominal wall hernia or abnormality. No abdominopelvic ascites. No pneumoperitoneum.  Musculoskeletal: No acute or significant osseous findings.  IMPRESSION: 1. 2 mm calculus in the proximal left ureter with resultant mild left hydronephrosis. Delayed left renal enhancement.   Electronically Signed   By: Titus Dubin M.D.   On: 02/20/2020 05:06  I have personally reviewed the images and agree with radiologist interpretation.   Assessment & Plan:    1. Left ureteral stone CT A/P w contrast revealed 2 mm calculus in proximal left ureter w/ resultant mild left hydronephrosis  Pt passed her stones  Stone sent for analysis at the patient's request We discussed general stone prevention techniques including drinking plenty water with goal of producing 2.5 L urine daily, increased citric acid intake, avoidance of high oxalate containing foods, and decreased salt intake.  Information about dietary recommendations given today.  We discussed metabolic evaluation as well as the indications for this.  More stone events per year or bilateral/multiple stones.  She does not yet meet the criteria.  We will likely start her on any additional medication for stone prevention this point in time but rather encourage dietary guidelines as above.  We spent a good amount of time diet today.  2. Left hydronephrosis  As above  Likely resolved after interval passage  Return if symptoms worsen or fail to improve.  [facility] [address] [place], [place] [address] [phone]  I, Donne Hazel, am acting as a scribe for Dr. Vanna Scotland,  I have reviewed the above documentation for accuracy and completeness, and I agree with the above.   Vanna Scotland, MD  I spent 30 min with this patient of which greater than 50% was spent in counseling and coordination of care with the patient.

## 2020-03-17 ENCOUNTER — Encounter: Payer: Self-pay | Admitting: Urology

## 2020-03-17 ENCOUNTER — Ambulatory Visit (INDEPENDENT_AMBULATORY_CARE_PROVIDER_SITE_OTHER): Payer: 59 | Admitting: Urology

## 2020-03-17 ENCOUNTER — Other Ambulatory Visit: Payer: Self-pay

## 2020-03-17 VITALS — BP 138/96 | HR 84

## 2020-03-17 DIAGNOSIS — N200 Calculus of kidney: Secondary | ICD-10-CM

## 2020-03-18 ENCOUNTER — Encounter: Payer: Self-pay | Admitting: Urology

## 2020-03-18 LAB — URINALYSIS, COMPLETE
Bilirubin, UA: NEGATIVE
Glucose, UA: NEGATIVE
Ketones, UA: NEGATIVE
Nitrite, UA: NEGATIVE
Protein,UA: NEGATIVE
RBC, UA: NEGATIVE
Specific Gravity, UA: 1.02 (ref 1.005–1.030)
Urobilinogen, Ur: 0.2 mg/dL (ref 0.2–1.0)
pH, UA: 7 (ref 5.0–7.5)

## 2020-03-18 LAB — MICROSCOPIC EXAMINATION: Epithelial Cells (non renal): >10 /hpf — AB (ref 0–10)

## 2020-03-18 NOTE — Telephone Encounter (Signed)
Left VM to return call, unsure which test.

## 2020-03-25 LAB — CALCULI, WITH PHOTOGRAPH (CLINICAL LAB)
Calcium Oxalate Dihydrate: 100 %
Weight Calculi: 14 mg

## 2020-05-30 ENCOUNTER — Other Ambulatory Visit: Payer: Self-pay | Admitting: Primary Care

## 2020-05-30 DIAGNOSIS — F331 Major depressive disorder, recurrent, moderate: Secondary | ICD-10-CM

## 2020-05-31 ENCOUNTER — Other Ambulatory Visit: Payer: Self-pay

## 2020-05-31 ENCOUNTER — Ambulatory Visit (INDEPENDENT_AMBULATORY_CARE_PROVIDER_SITE_OTHER): Payer: 59 | Admitting: Family Medicine

## 2020-05-31 ENCOUNTER — Encounter: Payer: Self-pay | Admitting: Family Medicine

## 2020-05-31 DIAGNOSIS — B078 Other viral warts: Secondary | ICD-10-CM

## 2020-05-31 DIAGNOSIS — B079 Viral wart, unspecified: Secondary | ICD-10-CM | POA: Insufficient documentation

## 2020-05-31 NOTE — Assessment & Plan Note (Signed)
Common wart on lower palm of R hand (in a hair desser with hx of eczema) After consent obtained- cleaned and pared down with sterile scalpel and the tx with liquid nitrogen (3 full freeze and thaw sessions) Pt tolerated very well  Aftercare discussed (protect until healed, then can return to otc remedy)  Handout given on warts and their origin and treatment -questions answered Will watch for s/s of infection  If not improved in a month can return for re treatment

## 2020-05-31 NOTE — Progress Notes (Signed)
Subjective:    Patient ID: Tracie [name], female    DOB: [birth year], 34 y.o.   MRN: [id number] This visit occurred during the SARS-CoV-2 public health emergency.  Safety protocols were in place, including screening questions prior to the visit, additional usage of staff PPE, and extensive cleaning of exam room while observing appropriate contact time as indicated for disinfecting solutions.    HPI 34 yo pt of NP Clark presents for a wart on her R hand   Tried home remedies  Did not work  It does not hurt  She works with hands -Social worker   She does not usually get warts  Had plantar wart years ago that was treated with cryo tx  Patient Active Problem List   Diagnosis Date Noted  . Wart 05/31/2020  . Conjunctivitis 09/18/2019  . Strep throat 10/23/2018  . GAD (generalized anxiety disorder) 07/05/2018  . Moderate episode of recurrent major depressive disorder (HCC) 03/29/2017  . 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 Frequent headaches 06/07/2016  . Sleep pattern disturbance 06/07/2016  . Vulvar abscess 12/29/2015  . Vulval hidradenitis suppurativa 09/08/2015  . Obesity affecting pregnancy in third trimester, antepartum 07/12/2015  . Supervision of normal pregnancy in third trimester 06/15/2015   Past Medical History:  Diagnosis Date  . Chicken pox   . Endometriosis 2013  . History of kidney stones   . 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 Medical history non-contributory   . Moderate episode of recurrent major depressive disorder (HCC) 03/29/2017  . Sleep pattern disturbance 06/07/2016  . Vulval hidradenitis suppurativa 09/08/2015  . Vulvar abscess 12/29/2015   Past Surgical History:  Procedure Laterality Date  . DIAGNOSTIC LAPAROSCOPY    . DILATION AND CURETTAGE OF UTERUS    . TONSILLECTOMY    . TONSILLECTOMY AND ADENOIDECTOMY  1994  . VULVA /PERINEUM BIOPSY Left 01/31/2016   Procedure: VULVAR BIOPSY;  Surgeon: Suzy Bouchard, MD;  Location: [facility] ORS;  Service:  Gynecology;  Laterality: Left;   Social History   Tobacco Use  . Smoking status: Never Smoker  . Smokeless tobacco: Never Used  Substance Use Topics  . Alcohol use: Yes  . Drug use: No   Family History  Problem Relation Age of Onset  . Breast cancer Maternal Grandmother   . Skin cancer Maternal Grandmother   . Lung cancer Maternal Grandfather   . Prostate cancer Paternal Grandfather    Allergies  Allergen Reactions  . Cephalexin Swelling  . Hydrocodone-Acetaminophen Nausea Only  . Hydrocodone-Acetaminophen Nausea Only   Current Outpatient Medications on File Prior to Visit  Medication Sig Dispense Refill  . ADALIMUMAB [place] Maintenance dosing 40mg  [place] weekly.  L73.2 hidardenitis.    buPROPion (WELLBUTRIN XL) 150 MG 24 hr tablet TAKE 1 TABLET (150 MG TOTAL) BY MOUTH DAILY. FOR DEPRESSION NEED APPOINTMENT FOR ANY MORE REFILLS 30 tablet 0  . cetirizine (ZYRTEC) 10 MG tablet Take 10 mg by mouth.    . DUPIXENT 300 MG/2ML prefilled syringe 1 injection every other week    . FLUoxetine (PROZAC) 40 MG capsule TAKE 1 CAPSULE (40 MG TOTAL) BY MOUTH DAILY. NEED APPOINTMENT FOR ANY MORE REFILL 30 capsule 0   No current facility-administered medications on file prior to visit.    Review of Systems  Constitutional: Negative for activity change, appetite change, fatigue, fever and unexpected weight change.  HENT: Negative for congestion, ear pain, rhinorrhea, sinus pressure and sore throat.   Eyes: Negative for pain, redness and visual disturbance.  Respiratory: Negative  for cough, shortness of breath and wheezing.   Cardiovascular: Negative for chest pain and palpitations.  Gastrointestinal: Negative for abdominal pain, blood in stool, constipation and diarrhea.  Endocrine: Negative for polydipsia and polyuria.  Genitourinary: Negative for dysuria, frequency and urgency.  Musculoskeletal: Negative for arthralgias, back pain and myalgias.  Skin: Negative for pallor and rash.       Wart on  R hand  Allergic/Immunologic: Negative for environmental allergies.  Neurological: Negative for dizziness, syncope and headaches.  Hematological: Negative for adenopathy. Does not bruise/bleed easily.  Psychiatric/Behavioral: Negative for decreased concentration and dysphoric mood. The patient is nervous/anxious.        Objective:   Physical Exam Constitutional:      General: She is not in acute distress.    Appearance: Normal appearance. She is obese. She is not ill-appearing.  Eyes:     General: No scleral icterus.    Conjunctiva/sclera: Conjunctivae normal.     Pupils: Pupils are equal, round, and reactive to light.  Cardiovascular:     Rate and Rhythm: Regular rhythm. Tachycardia present.  Pulmonary:     Effort: Pulmonary effort is normal. No respiratory distress.  Skin:    General: Skin is warm and dry.     Findings: No rash.     Comments: Simple wart noted on palm of R hand (lower) - with hyperkeratosis and some tiny black dots/vessel marks  Non tender   No other warts noted  Numerous tattoos on arms- none appear infected  Neurological:     Mental Status: She is alert.     Sensory: No sensory deficit.  Psychiatric:        Mood and Affect: Mood is anxious.     Comments: Mildly anxious Very pleasan t           Assessment & Plan:   Problem List Items Addressed This Visit      Other   Wart    Common wart on lower palm of R hand (in a hair desser with hx of eczema) After consent obtained- cleaned and pared down with sterile scalpel and the tx with liquid nitrogen (3 full freeze and thaw sessions) Pt tolerated very well  Aftercare discussed (protect until healed, then can return to otc remedy)  Handout given on warts and their origin and treatment -questions answered Will watch for s/s of infection  If not improved in a month can return for re treatment

## 2020-05-31 NOTE — Patient Instructions (Signed)
The wart will scab/blister- keep clean with soap and water Cover if needed  Then-once healed you can go back to some over the counter products (salicylic acid patches/ compound w)   If any problems, let us know   In a month- if not improved , come back for another treatment

## 2020-06-09 DIAGNOSIS — K603 Anal fistula: Principal | ICD-10-CM

## 2020-06-09 MED ORDER — HUMIRA PEN CITRATE FREE 40 MG/0.4 ML
11 refills | 0 days
Start: 2020-06-09 — End: ?

## 2020-06-22 ENCOUNTER — Encounter: Payer: Self-pay | Admitting: Primary Care

## 2020-06-22 ENCOUNTER — Other Ambulatory Visit: Payer: Self-pay

## 2020-06-22 ENCOUNTER — Ambulatory Visit (INDEPENDENT_AMBULATORY_CARE_PROVIDER_SITE_OTHER): Payer: 59 | Admitting: Primary Care

## 2020-06-22 VITALS — BP 110/62 | HR 85 | Temp 97.9°F | Ht 64.0 in | Wt 205.0 lb

## 2020-06-22 DIAGNOSIS — Z23 Encounter for immunization: Secondary | ICD-10-CM

## 2020-06-22 DIAGNOSIS — F411 Generalized anxiety disorder: Secondary | ICD-10-CM

## 2020-06-22 DIAGNOSIS — F331 Major depressive disorder, recurrent, moderate: Secondary | ICD-10-CM | POA: Diagnosis not present

## 2020-06-22 DIAGNOSIS — L309 Dermatitis, unspecified: Secondary | ICD-10-CM

## 2020-06-22 DIAGNOSIS — K603 Anal fistula: Principal | ICD-10-CM

## 2020-06-22 MED ORDER — BUPROPION HCL ER (XL) 150 MG PO TB24: 150.0000 mg | ORAL_TABLET | Freq: Every day | ORAL | 3 refills | Status: DC

## 2020-06-22 MED ORDER — FLUOXETINE HCL 40 MG PO CAPS: 40.0000 mg | ORAL_CAPSULE | Freq: Every day | ORAL | 3 refills | Status: DC

## 2020-06-22 MED ORDER — ADALIMUMAB PEN CITRATE FREE 40 MG/0.4 ML
SUBCUTANEOUS | 6 refills | 0.00000 days | Status: CP
Start: 2020-06-22 — End: ?

## 2020-06-22 NOTE — Progress Notes (Signed)
Subjective:    Patient ID: Tracie [name], female    DOB: [birth year], 34 y.o.   MRN: [id number]  HPI  This visit occurred during the SARS-CoV-2 public health emergency.  Safety protocols were in place, including screening questions prior to the visit, additional usage of staff PPE, and extensive cleaning of exam room while observing appropriate contact time as indicated for disinfecting solutions.   Tracie [name] is a 34 year old female with a history of frequent headaches, MDD, GAD who presents today for medication refill and follow up.  Currently prescribed bupropion XL 150 mg daily and fluoxetine 40 mg daily for anxiety and depression. She ran out of both of medications several weeks ago, and has noticed an increase in depression and anxiety symptoms. While taking both medications she felt better and believes they have helped overall. She is questioning whether or not her body became "immune" or adjusted to the doses as there have been times when she felt that her medication wasn't effective.   Following with dermatology and is currently managed on minocycline and Dupixent. Overall feels well managed on this regimen.   BP Readings from Last 3 Encounters:  06/22/20 110/62  05/31/20 118/78  03/17/20 (!) 138/96     Review of Systems  Respiratory: Negative for shortness of breath.   Cardiovascular: Negative for chest pain.  Skin:       Chronic eczema, follows with dermatology   Psychiatric/Behavioral: The patient is nervous/anxious.        See HPI       Past Medical History:  Diagnosis Date  . Chicken pox   . Endometriosis 2013  . History of kidney stones   . 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 Medical history non-contributory   . Moderate episode of recurrent major depressive disorder (HCC) 03/29/2017  . Sleep pattern disturbance 06/07/2016  . Vulval hidradenitis suppurativa 09/08/2015  . Vulvar abscess 12/29/2015     Social History   Socioeconomic History  . Marital status:  Married    Spouse name: Not on file  . Number of children: Not on file  . Years of education: Not on file  . Highest education level: Not on file  Occupational History  . Not on file  Tobacco Use  . Smoking status: Never Smoker  . Smokeless tobacco: Never Used  Substance and Sexual Activity  . Alcohol use: Yes  . Drug use: No  . Sexual activity: Not on file  Other Topics Concern  . Not on file  Social History Narrative   Married.   2 children.    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   Enjoys painting, spending time with family.   Social Determinants of Health   Financial Resource Strain:   . Difficulty of Paying Living Expenses: Not on file  Food Insecurity:   . Worried About Programme researcher, broadcasting/film/video in the Last Year: Not on file  . Ran Out of Food in the Last Year: Not on file  Transportation Needs:   . Lack of Transportation (Medical): Not on file  . Lack of Transportation (Non-Medical): Not on file  Physical Activity:   . Days of Exercise per Week: Not on file  . Minutes of Exercise per Session: Not on file  Stress:   . Feeling of Stress : Not on file  Social Connections:   . Frequency of Communication with Friends and Family: Not on file  . Frequency of Social Gatherings with Friends and Family: Not on  file  . Attends Religious Services: Not on file  . Active Member of Clubs or Organizations: Not on file  . Attends Banker Meetings: Not on file  . Marital Status: Not on file  Intimate Partner Violence:   . Fear of Current or Ex-Partner: Not on file  . Emotionally Abused: Not on file  . Physically Abused: Not on file  . Sexually Abused: Not on file    Past Surgical History:  Procedure Laterality Date  . DIAGNOSTIC LAPAROSCOPY    . DILATION AND CURETTAGE OF UTERUS    . TONSILLECTOMY    . TONSILLECTOMY AND ADENOIDECTOMY  1994  . VULVA /PERINEUM BIOPSY Left 01/31/2016   Procedure: VULVAR BIOPSY;  Surgeon: Suzy Bouchard, MD;   Location: [facility] ORS;  Service: Gynecology;  Laterality: Left;    Family History  Problem Relation Age of Onset  . Breast cancer Maternal Grandmother   . Skin cancer Maternal Grandmother   . Lung cancer Maternal Grandfather   . Prostate cancer Paternal Grandfather     Allergies  Allergen Reactions  . Cephalexin Swelling  . Hydrocodone-Acetaminophen Nausea Only  . Hydrocodone-Acetaminophen Nausea Only    Current Outpatient Medications on File Prior to Visit  Medication Sig Dispense Refill  . ADALIMUMAB [place] Maintenance dosing 40mg  [place] weekly.  L73.2 hidardenitis.    cetirizine (ZYRTEC) 10 MG tablet Take 10 mg by mouth.    . minocycline (MINOCIN) 100 MG capsule Take 100 mg by mouth 2 (two) times daily.    [place] triamcinolone ointment (KENALOG) 0.1 % Apply topically.    . DUPIXENT 300 MG/2ML prefilled syringe 1 injection every other week (Patient not taking: Reported on 06/22/2020)     No current facility-administered medications on file prior to visit.    BP 110/62   Pulse 85   Temp 97.9 F (36.6 C) (Temporal)   Ht 5\' 4"  (1.626 m)   Wt 205 lb (93 kg)   SpO2 97%   BMI 35.19 kg/m    Objective:   Physical Exam Cardiovascular:     Rate and Rhythm: Normal rate and regular rhythm.  Pulmonary:     Effort: Pulmonary effort is normal.     Breath sounds: Normal breath sounds.  Musculoskeletal:     Cervical back: Neck supple.  Skin:    General: Skin is warm and dry.  Psychiatric:        Mood and Affect: Mood normal.            Assessment & Plan:

## 2020-06-22 NOTE — Patient Instructions (Addendum)
Resume your bupropion (Wellbutrin) and fluoxetine (Prozac) daily for anxiety and depression. Start by adding back one medication for one-two weeks, then add back the other.  Please update me in regard to anxiety and depression as discussed.  It was a pleasure to see you today!   Influenza (Flu) Vaccine (Inactivated or Recombinant): What You Need to Know 1. Why get vaccinated? Influenza vaccine can prevent influenza (flu). Flu is a contagious disease that spreads around the [place] every year, usually between October and May. Anyone can get the flu, but it is more dangerous for some people. Infants and young children, people 86 years of age and older, pregnant women, and people with certain health conditions or a weakened immune system are at greatest risk of flu complications. Pneumonia, bronchitis, sinus infections and ear infections are examples of flu-related complications. If you have a medical condition, such as heart disease, cancer or diabetes, flu can make it worse. Flu can cause fever and chills, sore throat, muscle aches, fatigue, cough, headache, and runny or stuffy nose. Some people may have vomiting and diarrhea, though this is more common in children than adults. Each year thousands of people in the [place] die from flu, and many more are hospitalized. Flu vaccine prevents millions of illnesses and flu-related visits to the doctor each year. 2. Influenza vaccine CDC recommends everyone 39 months of age and older get vaccinated every flu season. Children 6 months through 71 years of age may need 2 doses during a single flu season. Everyone else needs only 1 dose each flu season. It takes about 2 weeks for protection to develop after vaccination. There are many flu viruses, and they are always changing. Each year a new flu vaccine is made to protect against three or four viruses that are likely to cause disease in the upcoming flu season. Even when the vaccine doesn't exactly  match these viruses, it may still provide some protection. Influenza vaccine does not cause flu. Influenza vaccine may be given at the same time as other vaccines. 3. Talk with your health care provider Tell your vaccine provider if the person getting the vaccine:  Has had an allergic reaction after a previous dose of influenza vaccine, or has any severe, life-threatening allergies.  Has ever had Guillain-Barr Syndrome (also called GBS). In some cases, your health care provider may decide to postpone influenza vaccination to a future visit. People with minor illnesses, such as a cold, may be vaccinated. People who are moderately or severely ill should usually wait until they recover before getting influenza vaccine. Your health care provider can give you more information. 4. Risks of a vaccine reaction  Soreness, redness, and swelling where shot is given, fever, muscle aches, and headache can happen after influenza vaccine.  There may be a very small increased risk of Guillain-Barr Syndrome (GBS) after inactivated influenza vaccine (the flu shot). Young children who get the flu shot along with pneumococcal vaccine (PCV13), and/or DTaP vaccine at the same time might be slightly more likely to have a seizure caused by fever. Tell your health care provider if a child who is getting flu vaccine has ever had a seizure. People sometimes faint after medical procedures, including vaccination. Tell your provider if you feel dizzy or have vision changes or ringing in the ears. As with any medicine, there is a very remote chance of a vaccine causing a severe allergic reaction, other serious injury, or death. 5. What if there is a serious problem? An  allergic reaction could occur after the vaccinated person leaves the clinic. If you see signs of a severe allergic reaction (hives, swelling of the face and throat, difficulty breathing, a fast heartbeat, dizziness, or weakness), call 9-1-1 and get the person  to the nearest hospital. For other signs that concern you, call your health care provider. Adverse reactions should be reported to the Vaccine Adverse Event Reporting System (VAERS). Your health care provider will usually file this report, or you can do it yourself. Visit the VAERS website at www.vaers.LAgents.no or call 7346171921.VAERS is only for reporting reactions, and VAERS staff do not give medical advice. 6. The National Vaccine Injury Compensation Program The [employer] Vaccine Injury Compensation Program (VICP) is a federal program that was created to compensate people who may have been injured by certain vaccines. Visit the VICP website at SpiritualWord.at or call 2063822799 to learn about the program and about filing a claim. There is a time limit to file a claim for compensation. 7. How can I learn more?  Ask your healthcare provider.  Call your local or state health department.  Contact the Centers for Disease Control and Prevention (CDC): ? Call 407-053-2174 (1-800-CDC-INFO) or ? Visit CDC's BiotechRoom.com.cy Vaccine Information Statement (Interim) Inactivated Influenza Vaccine (05/23/2018) This information is not intended to replace advice given to you by your health care provider. Make sure you discuss any questions you have with your health care provider. Document Revised: 01/14/2019 Document Reviewed: 05/27/2018 Elsevier Patient Education  2020 [employer].

## 2020-06-22 NOTE — Assessment & Plan Note (Signed)
Following with dermatology, overall doing well on minocycline and Dupixent. Continue same.

## 2020-06-22 NOTE — Assessment & Plan Note (Signed)
Overall seems to be doing well on bupropion and fluoxetine, will resume medications one at a time as she's been out for several weeks. ° °Continue bupropion Xl 150 mg daily and fluoxetine 40 mg daily, she will update in about four weeks. Consider dose increase of bupropion to 300 mg if needed. °

## 2020-06-22 NOTE — Assessment & Plan Note (Signed)
Overall seems to be doing well on bupropion and fluoxetine, will resume medications one at a time as she's been out for several weeks.  Continue bupropion Xl 150 mg daily and fluoxetine 40 mg daily, she will update in about four weeks. Consider dose increase of bupropion to 300 mg if needed.

## 2020-06-28 MED ORDER — ADALIMUMAB PEN CITRATE FREE 40 MG/0.4 ML
SUBCUTANEOUS | 6 refills | 0.00000 days | Status: CP
Start: 2020-06-28 — End: ?

## 2020-06-29 ENCOUNTER — Other Ambulatory Visit (INDEPENDENT_AMBULATORY_CARE_PROVIDER_SITE_OTHER): Payer: 59

## 2020-06-29 ENCOUNTER — Other Ambulatory Visit: Payer: Self-pay

## 2020-06-29 ENCOUNTER — Ambulatory Visit (INDEPENDENT_AMBULATORY_CARE_PROVIDER_SITE_OTHER): Payer: 59 | Admitting: Primary Care

## 2020-06-29 VITALS — BP 110/62 | HR 83 | Temp 97.4°F | Ht 64.0 in | Wt 203.0 lb

## 2020-06-29 DIAGNOSIS — L309 Dermatitis, unspecified: Secondary | ICD-10-CM | POA: Diagnosis not present

## 2020-06-29 DIAGNOSIS — F411 Generalized anxiety disorder: Secondary | ICD-10-CM

## 2020-06-29 DIAGNOSIS — Z0000 Encounter for general adult medical examination without abnormal findings: Secondary | ICD-10-CM | POA: Diagnosis not present

## 2020-06-29 DIAGNOSIS — R7989 Other specified abnormal findings of blood chemistry: Secondary | ICD-10-CM | POA: Diagnosis not present

## 2020-06-29 DIAGNOSIS — R519 Headache, unspecified: Secondary | ICD-10-CM | POA: Diagnosis not present

## 2020-06-29 DIAGNOSIS — F331 Major depressive disorder, recurrent, moderate: Secondary | ICD-10-CM | POA: Diagnosis not present

## 2020-06-29 LAB — LIPID PANEL
Cholesterol: 221 mg/dL — ABNORMAL HIGH (ref 0–200)
HDL: 43.6 mg/dL (ref >39.00)
NonHDL: 177.29
Total CHOL/HDL Ratio: 5
Triglycerides: 219 mg/dL — ABNORMAL HIGH (ref 0.0–149.0)
VLDL: 43.8 mg/dL — ABNORMAL HIGH (ref 0.0–40.0)

## 2020-06-29 LAB — COMPREHENSIVE METABOLIC PANEL
ALT: 14 U/L (ref 0–35)
AST: 14 U/L (ref 0–37)
Albumin: 4.1 g/dL (ref 3.5–5.2)
Alkaline Phosphatase: 61 U/L (ref 39–117)
BUN: 12 mg/dL (ref 6–23)
CO2: 26 mEq/L (ref 19–32)
Calcium: 9 mg/dL (ref 8.4–10.5)
Chloride: 102 mEq/L (ref 96–112)
Creatinine, Ser: 0.67 mg/dL (ref 0.40–1.20)
GFR: 102.06 mL/min (ref >60.00)
Glucose, Bld: 90 mg/dL (ref 70–99)
Potassium: 4 mEq/L (ref 3.5–5.1)
Sodium: 135 mEq/L (ref 135–145)
Total Bilirubin: 0.4 mg/dL (ref 0.2–1.2)
Total Protein: 7.5 g/dL (ref 6.0–8.3)

## 2020-06-29 LAB — CBC
HCT: 42.3 % (ref 36.0–46.0)
Hemoglobin: 14.3 g/dL (ref 12.0–15.0)
MCHC: 33.8 g/dL (ref 30.0–36.0)
MCV: 90.2 fl (ref 78.0–100.0)
Platelets: 229 10*3/uL (ref 150.0–400.0)
RBC: 4.69 Mil/uL (ref 3.87–5.11)
RDW: 12.5 % (ref 11.5–15.5)
WBC: 6.8 10*3/uL (ref 4.0–10.5)

## 2020-06-29 LAB — TSH: TSH: 0.31 u[IU]/mL — ABNORMAL LOW (ref 0.35–4.50)

## 2020-06-29 LAB — T4, FREE: Free T4: 0.84 ng/dL (ref 0.60–1.60)

## 2020-06-29 LAB — LDL CHOLESTEROL, DIRECT: Direct LDL: 135 mg/dL

## 2020-06-29 MED ORDER — PROPRANOLOL HCL ER 80 MG PO CP24: 80.0000 mg | ORAL_CAPSULE | Freq: Every day | ORAL | 0 refills | Status: DC

## 2020-06-29 MED ORDER — SUMATRIPTAN SUCCINATE 50 MG PO TABS: ORAL_TABLET | ORAL | 0 refills | Status: DC

## 2020-06-29 NOTE — Assessment & Plan Note (Addendum)
Uncontrolled despite OTC treatment. Headaches have been frequent occurring at least twice weekly.  Neuro exam unremarkable.   Will start propranolol for headache prevention & sumatriptan for abortive therapy.  She will follow up in 1 month via my chart.     Agree with assessment and plan. Doreene Nest, NP

## 2020-06-29 NOTE — Progress Notes (Signed)
Subjective:    Patient ID: Tracie [name], female    DOB: [birth year], 34 y.o.   MRN: [id number]  HPI   This visit occurred during the SARS-CoV-2 public health emergency.  Safety protocols were in place, including screening questions prior to the visit, additional usage of staff PPE, and extensive cleaning of exam room while observing appropriate contact time as indicated for disinfecting solutions.   Tracie [name] is a 34 year old female with a history of frequent headaches, eczema, moderate recurrent major depressive disorder, GAD who presents today for complete physical.  Frequent headaches: Currently having at least 2 temporal headaches each week. Associated with photophobia & phonophobia. No relief with ibuprofen. She has to lay down in a dark room to resolve them.    Eczema: Managed by dermatotomy currently using triamcinolone and bleach baths twice weekly. Stopped Dupixent in May 2021 due itching and watery eyes. Eczema flares have been more often since May 2021. Has been referred to Ophthalmology for itching and watery eyes by dermatology.    Major depressive disorder/GAD: In August 2021 she ran off of fluoxetine & bupropion, she was off of these medication for 1 month. She was seen on 06/22/20 and restarted fluoxetine at that time. She plans to restart bupropion 150mg  in one week as instructed.     Upon abruptly stopping these medication she did notice worsening of depression and anxiety. Current PHQ-9 score 25. Denies any suicidal or homicidal thoughts.    Immunizations: -Tetanus: 2016 -Influenza: UTD -Covid-19: Completed in April 2021 -HPV: Not completed   Diet: Reports poor diet Exercise: has not began but is working on coming up with a plan.   Eye exam: Due  Dental exam: UTD, every 6 months   Pap Smear: Due 2022 Hep C Screen: Completed in 2019   BP Readings from Last 3 Encounters:  06/29/20 110/62  06/22/20 110/62  05/31/20 118/78     Review of Systems    Constitutional: Negative.   HENT: Negative.   Eyes: Positive for itching. Negative for pain, discharge, redness and visual disturbance.  Respiratory: Negative.  Negative for chest tightness and shortness of breath.   Cardiovascular: Negative.  Negative for chest pain.  Gastrointestinal: Negative.   Endocrine: Negative.   Musculoskeletal: Negative.  Negative for joint swelling and myalgias.  Skin: Positive for rash.  Allergic/Immunologic: Negative.   Neurological: Positive for headaches. Negative for dizziness, weakness, light-headedness and numbness.  Hematological: Negative.   Psychiatric/Behavioral: Negative for suicidal ideas. The patient is nervous/anxious.        Past Medical History:  Diagnosis Date  . Chicken pox   . Endometriosis 2013  . History of kidney stones   . 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 Medical history non-contributory   . Moderate episode of recurrent major depressive disorder (HCC) 03/29/2017  . Obesity affecting pregnancy in third trimester, antepartum 07/12/2015  . Sleep pattern disturbance 06/07/2016  . Supervision of normal pregnancy in third trimester 06/15/2015   Overview:  34 y.o. G3P1011 at [redacted]w[redacted]d by  LMP of 11/05/14  Factors complicating this pregnancy   Obesity  Screening results and needs:  NOB:   MBT  A+  Ab screen  Neg Pap  HIV  Hep B/RPR negative/negative  Rubella immune   VZV   Aneuploidy:   First trimester (Informaseq, NT):   Second trimester (AFP/tetra):   28 weeks:   Blood consent:  Hgb:  11.7  Glucola: 143, 3 hr GTT was normal (85,143  . Vulval hidradenitis suppurativa 09/08/2015  .  Vulvar abscess 12/29/2015     Social History   Socioeconomic History  . Marital status: Married    Spouse name: Not on file  . Number of children: Not on file  . Years of education: Not on file  . Highest education level: Not on file  Occupational History  . Not on file  Tobacco Use  . Smoking status: Never Smoker  . Smokeless tobacco: Never Used   Substance and Sexual Activity  . Alcohol use: Yes  . Drug use: No  . Sexual activity: Not on file  Other Topics Concern  . Not on file  Social History Narrative   Married.   2 children.    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   Enjoys painting, spending time with family.   Social Determinants of Health   Financial Resource Strain:   . Difficulty of Paying Living Expenses: Not on file  Food Insecurity:   . Worried About Programme researcher, broadcasting/film/video in the Last Year: Not on file  . Ran Out of Food in the Last Year: Not on file  Transportation Needs:   . Lack of Transportation (Medical): Not on file  . Lack of Transportation (Non-Medical): Not on file  Physical Activity:   . Days of Exercise per Week: Not on file  . Minutes of Exercise per Session: Not on file  Stress:   . Feeling of Stress : Not on file  Social Connections:   . Frequency of Communication with Friends and Family: Not on file  . Frequency of Social Gatherings with Friends and Family: Not on file  . Attends Religious Services: Not on file  . Active Member of Clubs or Organizations: Not on file  . Attends Banker Meetings: Not on file  . Marital Status: Not on file  Intimate Partner Violence:   . Fear of Current or Ex-Partner: Not on file  . Emotionally Abused: Not on file  . Physically Abused: Not on file  . Sexually Abused: Not on file    Past Surgical History:  Procedure Laterality Date  . DIAGNOSTIC LAPAROSCOPY    . DILATION AND CURETTAGE OF UTERUS    . TONSILLECTOMY    . TONSILLECTOMY AND ADENOIDECTOMY  1994  . VULVA /PERINEUM BIOPSY Left 01/31/2016   Procedure: VULVAR BIOPSY;  Surgeon: Suzy Bouchard, MD;  Location: [facility] ORS;  Service: Gynecology;  Laterality: Left;    Family History  Problem Relation Age of Onset  . Breast cancer Maternal Grandmother   . Skin cancer Maternal Grandmother   . Lung cancer Maternal Grandfather   . Prostate cancer Paternal Grandfather      Allergies  Allergen Reactions  . Cephalexin Swelling  . Hydrocodone-Acetaminophen Nausea Only  . Hydrocodone-Acetaminophen Nausea Only    Current Outpatient Medications on File Prior to Visit  Medication Sig Dispense Refill  . ADALIMUMAB [place] Maintenance dosing 40mg  [place] weekly.  L73.2 hidardenitis.    buPROPion (WELLBUTRIN XL) 150 MG 24 hr tablet Take 1 tablet (150 mg total) by mouth daily. For depression 90 tablet 3  . cetirizine (ZYRTEC) 10 MG tablet Take 10 mg by mouth.    . DUPIXENT 300 MG/2ML prefilled syringe 1 injection every other week    . FLUoxetine (PROZAC) 40 MG capsule Take 1 capsule (40 mg total) by mouth daily. For anxiety and depression 90 capsule 3  . minocycline (MINOCIN) 100 MG capsule Take 100 mg by mouth 2 (two) times daily.    Marland Kitchen  triamcinolone ointment (KENALOG) 0.1 % Apply topically.     No current facility-administered medications on file prior to visit.    BP 110/62   Pulse 83   Temp (!) 97.4 F (36.3 C) (Temporal)   Ht 5\' 4"  (1.626 m)   Wt 203 lb (92.1 kg)   SpO2 98%   BMI 34.84 kg/m    Objective:   Physical Exam Constitutional:      Appearance: Normal appearance.  HENT:     Right Ear: Tympanic membrane normal.     Left Ear: Tympanic membrane normal.     Nose: Nose normal.  Eyes:     Pupils: Pupils are equal, round, and reactive to light.  Cardiovascular:     Rate and Rhythm: Normal rate and regular rhythm.     Pulses: Normal pulses.     Heart sounds: Normal heart sounds.  Pulmonary:     Effort: Pulmonary effort is normal.     Breath sounds: Normal breath sounds.  Abdominal:     General: Bowel sounds are normal.     Palpations: Abdomen is soft.     Tenderness: There is no abdominal tenderness. There is no guarding.  Musculoskeletal:        General: Normal range of motion.     Cervical back: Normal range of motion and neck supple.  Skin:    General: Skin is warm and dry.     Capillary Refill: Capillary refill takes less than 2  seconds.  Neurological:     General: No focal deficit present.     Mental Status: She is alert and oriented to person, place, and time.  Psychiatric:        Mood and Affect: Mood normal.        Behavior: Behavior normal.        Thought Content: Thought content does not include homicidal or suicidal ideation. Thought content does not include homicidal or suicidal plan.        Cognition and Memory: Cognition and memory normal.           Assessment & Plan:

## 2020-06-29 NOTE — Assessment & Plan Note (Addendum)
Immunizations: Tetanus: UTD Influenza: UTD, completed Sept 2021 Covid-19: Completed in April 2021  Eye exam: Due pending appointment  Dental exam: UTD, every 6 months  Pap Smear: UTD, repeat in 2022  Exam unremarkable today. Dicussed healthy lifestyle changes such has diet and exercise.    Agree with assessment and plan. Doreene Nest, NP

## 2020-06-29 NOTE — Progress Notes (Signed)
Subjective:    Patient ID: Tracie [name], female    DOB: Nov 03, 1985, 34 y.o.   MRN: [id number]  HPI  This visit occurred during the SARS-CoV-2 public health emergency.  Safety protocols were in place, including screening questions prior to the visit, additional usage of staff PPE, and extensive cleaning of exam room while observing appropriate contact time as indicated for disinfecting solutions.   Tracie [name] is a 34 year old female who presents today for complete physical.  She would also like to discuss frequent headaches. Long history of bilateral temporal headaches with symptoms photophobia and phonophobia. Headaches occur twice weekly which will eventually turn into a migraine. This will occur once weekly. She will take Tylenol or Motrin without improvement, will have to lay down in a dark room for symptoms to improve.   Immunizations: -Tetanus: Completed in 2016 -Influenza: Completed this season  -Covid-19: Completed series  Diet: She endorses a poor diet.  Exercise: No regular exercise.  Eye exam: Due  Dental exam: Completes semi-annually   Pap Smear: Completed in 2019  BP Readings from Last 3 Encounters:  06/29/20 110/62  06/22/20 110/62  05/31/20 118/78     Review of Systems  Constitutional: Negative for unexpected weight change.  HENT: Negative for rhinorrhea.   Respiratory: Negative for cough and shortness of breath.   Cardiovascular: Negative for chest pain.  Gastrointestinal: Negative for constipation and diarrhea.  Genitourinary: Negative for difficulty urinating and menstrual problem.  Musculoskeletal: Negative for arthralgias and myalgias.  Skin: Negative for rash.  Allergic/Immunologic: Negative for environmental allergies.  Neurological: Positive for headaches. Negative for dizziness and numbness.  Psychiatric/Behavioral:       Continued depression and anxiety, see last visit.       Past Medical History:  Diagnosis Date  . Chicken pox   .  Endometriosis 2013  . History of kidney stones   . 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 Medical history non-contributory   . Moderate episode of recurrent major depressive disorder (HCC) 03/29/2017  . Obesity affecting pregnancy in third trimester, antepartum 07/12/2015  . Sleep pattern disturbance 06/07/2016  . Supervision of normal pregnancy in third trimester 06/15/2015   Overview:  34 y.o. G3P1011 at [redacted]w[redacted]d by  LMP of 11/05/14  Factors complicating this pregnancy   Obesity  Screening results and needs:  NOB:   MBT  A+  Ab screen  Neg Pap  HIV  Hep B/RPR negative/negative  Rubella immune   VZV   Aneuploidy:   First trimester (Informaseq, NT):   Second trimester (AFP/tetra):   28 weeks:   Blood consent:  Hgb:  11.7  Glucola: 143, 3 hr GTT was normal (85,143  . Vulval hidradenitis suppurativa 09/08/2015  . Vulvar abscess 12/29/2015     Social History   Socioeconomic History  . Marital status: Married    Spouse name: Not on file  . Number of children: Not on file  . Years of education: Not on file  . Highest education level: Not on file  Occupational History  . Not on file  Tobacco Use  . Smoking status: Never Smoker  . Smokeless tobacco: Never Used  Substance and Sexual Activity  . Alcohol use: Yes  . Drug use: No  . Sexual activity: Not on file  Other Topics Concern  . Not on file  Social History Narrative   Married.   2 children.    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   Enjoys painting, spending time with  family.   Social Determinants of Health   Financial Resource Strain:   . Difficulty of Paying Living Expenses: Not on file  Food Insecurity:   . Worried About Programme researcher, broadcasting/film/video in the Last Year: Not on file  . Ran Out of Food in the Last Year: Not on file  Transportation Needs:   . Lack of Transportation (Medical): Not on file  . Lack of Transportation (Non-Medical): Not on file  Physical Activity:   . Days of Exercise per Week: Not on file  .  Minutes of Exercise per Session: Not on file  Stress:   . Feeling of Stress : Not on file  Social Connections:   . Frequency of Communication with Friends and Family: Not on file  . Frequency of Social Gatherings with Friends and Family: Not on file  . Attends Religious Services: Not on file  . Active Member of Clubs or Organizations: Not on file  . Attends Banker Meetings: Not on file  . Marital Status: Not on file  Intimate Partner Violence:   . Fear of Current or Ex-Partner: Not on file  . Emotionally Abused: Not on file  . Physically Abused: Not on file  . Sexually Abused: Not on file    Past Surgical History:  Procedure Laterality Date  . DIAGNOSTIC LAPAROSCOPY    . DILATION AND CURETTAGE OF UTERUS    . TONSILLECTOMY    . TONSILLECTOMY AND ADENOIDECTOMY  1994  . VULVA /PERINEUM BIOPSY Left 01/31/2016   Procedure: VULVAR BIOPSY;  Surgeon: Suzy Bouchard, MD;  Location: [facility] ORS;  Service: Gynecology;  Laterality: Left;    Family History  Problem Relation Age of Onset  . Breast cancer Maternal Grandmother   . Skin cancer Maternal Grandmother   . Lung cancer Maternal Grandfather   . Prostate cancer Paternal Grandfather     Allergies  Allergen Reactions  . Cephalexin Swelling  . Hydrocodone-Acetaminophen Nausea Only  . Hydrocodone-Acetaminophen Nausea Only    Current Outpatient Medications on File Prior to Visit  Medication Sig Dispense Refill  . ADALIMUMAB [place] Maintenance dosing 40mg  [place] weekly.  L73.2 hidardenitis.    buPROPion (WELLBUTRIN XL) 150 MG 24 hr tablet Take 1 tablet (150 mg total) by mouth daily. For depression 90 tablet 3  . cetirizine (ZYRTEC) 10 MG tablet Take 10 mg by mouth.    . DUPIXENT 300 MG/2ML prefilled syringe 1 injection every other week    . FLUoxetine (PROZAC) 40 MG capsule Take 1 capsule (40 mg total) by mouth daily. For anxiety and depression 90 capsule 3  . minocycline (MINOCIN) 100 MG capsule Take 100 mg by mouth 2  (two) times daily.    Marland Kitchen triamcinolone ointment (KENALOG) 0.1 % Apply topically.     No current facility-administered medications on file prior to visit.    BP 110/62   Pulse 83   Temp (!) 97.4 F (36.3 C) (Temporal)   Ht 5\' 4"  (1.626 m)   Wt 203 lb (92.1 kg)   SpO2 98%   BMI 34.84 kg/m    Objective:   Physical Exam HENT:     Right Ear: Tympanic membrane and ear canal normal.     Left Ear: Tympanic membrane and ear canal normal.  Eyes:     Pupils: Pupils are equal, round, and reactive to light.  Cardiovascular:     Rate and Rhythm: Normal rate and regular rhythm.  Pulmonary:     Effort: Pulmonary effort is normal.  Breath sounds: Normal breath sounds.  Abdominal:     General: Bowel sounds are normal.     Palpations: Abdomen is soft.     Tenderness: There is no abdominal tenderness.  Musculoskeletal:        General: Normal range of motion.     Cervical back: Neck supple.  Skin:    General: Skin is warm and dry.  Neurological:     Mental Status: She is alert and oriented to person, place, and time.     Cranial Nerves: No cranial nerve deficit.     Deep Tendon Reflexes:     Reflex Scores:      Patellar reflexes are 2+ on the right side and 2+ on the left side. Psychiatric:        Mood and Affect: Mood normal.            Assessment & Plan:

## 2020-06-29 NOTE — Patient Instructions (Addendum)
Stop by the lab prior to leaving today. I will notify you of your results once received.    Begin taking Propranolol (Inderal) 80 mg by mouth once daily in the morning for headache prevention.   You will be contacted regarding your referral to therapy.  Please let us know if you have not been contacted within two weeks.     Healthy Eating Following a healthy eating pattern may help you to achieve and maintain a healthy body weight, reduce the risk of chronic disease, and live a long and productive life. It is important to follow a healthy eating pattern at an appropriate calorie level for your body. Your nutritional needs should be met primarily through food by choosing a variety of nutrient-rich foods. What are tips for following this plan? Reading food labels  Read labels and choose the following: ? Reduced or low sodium. ? Juices with 100% fruit juice. ? Foods with low saturated fats and high polyunsaturated and monounsaturated fats. ? Foods with whole grains, such as whole wheat, cracked wheat, brown rice, and wild rice. ? Whole grains that are fortified with folic acid. This is recommended for women who are pregnant or who want to become pregnant.  Read labels and avoid the following: ? Foods with a lot of added sugars. These include foods that contain brown sugar, corn sweetener, corn syrup, dextrose, fructose, glucose, high-fructose corn syrup, honey, invert sugar, lactose, malt syrup, maltose, molasses, raw sugar, sucrose, trehalose, or turbinado sugar.  Do not eat more than the following amounts of added sugar per day:  6 teaspoons (25 g) for women.  9 teaspoons (38 g) for men. ? Foods that contain processed or refined starches and grains. ? Refined grain products, such as Rafferty flour, degermed cornmeal, Wiedman bread, and Schley rice. Shopping  Choose nutrient-rich snacks, such as vegetables, whole fruits, and nuts. Avoid high-calorie and high-sugar snacks, such as potato  chips, fruit snacks, and candy.  Use oil-based dressings and spreads on foods instead of solid fats such as butter, stick margarine, or cream cheese.  Limit pre-made sauces, mixes, and "instant" products such as flavored rice, instant noodles, and ready-made pasta.  Try more plant-protein sources, such as tofu, tempeh, black beans, edamame, lentils, nuts, and seeds.  Explore eating plans such as the Mediterranean diet or vegetarian diet. Cooking  Use oil to saut or stir-fry foods instead of solid fats such as butter, stick margarine, or lard.  Try baking, boiling, grilling, or broiling instead of frying.  Remove the fatty part of meats before cooking.  Steam vegetables in water or broth. Meal planning   At meals, imagine dividing your plate into fourths: ? One-half of your plate is fruits and vegetables. ? One-fourth of your plate is whole grains. ? One-fourth of your plate is protein, especially lean meats, poultry, eggs, tofu, beans, or nuts.  Include low-fat dairy as part of your daily diet. Lifestyle  Choose healthy options in all settings, including home, work, school, restaurants, or stores.  Prepare your food safely: ? Wash your hands after handling raw meats. ? Keep food preparation surfaces clean by regularly washing with hot, soapy water. ? Keep raw meats separate from ready-to-eat foods, such as fruits and vegetables. ? Cook seafood, meat, poultry, and eggs to the recommended internal temperature. ? Store foods at safe temperatures. In general:  Keep cold foods at 9F (4.4C) or below.  Keep hot foods at 19F (60C) or above.  Keep your freezer at 38F (-17.8C) or  below.  Foods are no longer safe to eat when they have been between the temperatures of 40-140F (4.4-60C) for more than 2 hours. What foods should I eat? Fruits Aim to eat 2 cup-equivalents of fresh, canned (in natural juice), or frozen fruits each day. Examples of 1 cup-equivalent of fruit  include 1 small apple, 8 large strawberries, 1 cup canned fruit,  cup dried fruit, or 1 cup 100% juice. Vegetables Aim to eat 2-3 cup-equivalents of fresh and frozen vegetables each day, including different varieties and colors. Examples of 1 cup-equivalent of vegetables include 2 medium carrots, 2 cups raw, leafy greens, 1 cup chopped vegetable (raw or cooked), or 1 medium baked potato. Grains Aim to eat 6 ounce-equivalents of whole grains each day. Examples of 1 ounce-equivalent of grains include 1 slice of bread, 1 cup ready-to-eat cereal, 3 cups popcorn, or  cup cooked rice, pasta, or cereal. Meats and other proteins Aim to eat 5-6 ounce-equivalents of protein each day. Examples of 1 ounce-equivalent of protein include 1 egg, 1/2 cup nuts or seeds, or 1 tablespoon (16 g) peanut butter. A cut of meat or fish that is the size of a deck of cards is about 3-4 ounce-equivalents.  Of the protein you eat each week, try to have at least 8 ounces come from seafood. This includes salmon, trout, herring, and anchovies. Dairy Aim to eat 3 cup-equivalents of fat-free or low-fat dairy each day. Examples of 1 cup-equivalent of dairy include 1 cup (240 mL) milk, 8 ounces (250 g) yogurt, 1 ounces (44 g) natural cheese, or 1 cup (240 mL) fortified soy milk. Fats and oils  Aim for about 5 teaspoons (21 g) per day. Choose monounsaturated fats, such as canola and olive oils, avocados, peanut butter, and most nuts, or polyunsaturated fats, such as sunflower, corn, and soybean oils, walnuts, pine nuts, sesame seeds, sunflower seeds, and flaxseed. Beverages  Aim for six 8-oz glasses of water per day. Limit coffee to three to five 8-oz cups per day.  Limit caffeinated beverages that have added calories, such as soda and energy drinks.  Limit alcohol intake to no more than 1 drink a day for nonpregnant women and 2 drinks a day for men. One drink equals 12 oz of beer (355 mL), 5 oz of wine (148 mL), or 1 oz of  hard liquor (44 mL). Seasoning and other foods  Avoid adding excess amounts of salt to your foods. Try flavoring foods with herbs and spices instead of salt.  Avoid adding sugar to foods.  Try using oil-based dressings, sauces, and spreads instead of solid fats. This information is based on general [place] nutrition guidelines. For more information, visit Build[employer].es. Exact amounts may vary based on your nutrition needs. Summary  A healthy eating plan may help you to maintain a healthy weight, reduce the risk of chronic diseases, and stay active throughout your life.  Plan your meals. Make sure you eat the right portions of a variety of nutrient-rich foods.  Try baking, boiling, grilling, or broiling instead of frying.  Choose healthy options in all settings, including home, work, school, restaurants, or stores. This information is not intended to replace advice given to you by your health care provider. Make sure you discuss any questions you have with your health care provider. Document Revised: 01/07/2018 Document Reviewed: 01/07/2018 Elsevier Patient Education  [place].

## 2020-06-29 NOTE — Assessment & Plan Note (Signed)
Compliant to fluoxetine for which she began one week ago. Will have her resume bupropion in another 1-2 weeks. Has been off medications for one month prior. Is interested in therapy, referral placed.

## 2020-06-29 NOTE — Assessment & Plan Note (Signed)
Active symptoms, resumed fluoxetine one week ago. Will have her continue with fluoxetine alone for another 1-2 weeks, then resume bupropion.   She will update. Referral placed to therapy.

## 2020-06-29 NOTE — Assessment & Plan Note (Signed)
Actually not taking Dupixent due to eye symptoms. Will be seeing optometry soon. Continue minocycline for acne. Follows with dermatology.

## 2020-07-23 ENCOUNTER — Other Ambulatory Visit: Payer: Self-pay | Admitting: Primary Care

## 2020-07-23 DIAGNOSIS — R519 Headache, unspecified: Secondary | ICD-10-CM

## 2020-07-26 DIAGNOSIS — R519 Headache, unspecified: Secondary | ICD-10-CM

## 2020-07-26 NOTE — Telephone Encounter (Signed)
Patient was to follow up in one month on my chart. No message received. Sent my chart message to see how it has been working. If working ok when we get response ok to send in 90 day?

## 2020-07-28 MED ORDER — TOPIRAMATE 50 MG PO TABS: 50.0000 mg | ORAL_TABLET | Freq: Every day | ORAL | 0 refills | Status: DC

## 2020-08-16 ENCOUNTER — Ambulatory Visit (INDEPENDENT_AMBULATORY_CARE_PROVIDER_SITE_OTHER): Payer: 59 | Admitting: Psychology

## 2020-08-16 DIAGNOSIS — F332 Major depressive disorder, recurrent severe without psychotic features: Secondary | ICD-10-CM

## 2020-08-16 DIAGNOSIS — F411 Generalized anxiety disorder: Secondary | ICD-10-CM | POA: Diagnosis not present

## 2020-08-20 ENCOUNTER — Other Ambulatory Visit: Payer: Self-pay | Admitting: Primary Care

## 2020-08-20 DIAGNOSIS — R519 Headache, unspecified: Secondary | ICD-10-CM

## 2020-08-20 NOTE — Telephone Encounter (Signed)
Patient was to update 3 weeks later after visit in October, she has not done so. Please see my chart message. We will refill if warranted.

## 2020-08-20 NOTE — Telephone Encounter (Signed)
Pharmacy requests refill on: Topiramate 50 mg   LAST REFILL: 07/28/2020 LAST OV: 06/10/2020 NEXT OV: Not Scheduled PHARMACY: [id number] [place], [place]

## 2020-08-23 ENCOUNTER — Ambulatory Visit (INDEPENDENT_AMBULATORY_CARE_PROVIDER_SITE_OTHER): Payer: 59 | Admitting: Psychology

## 2020-08-23 DIAGNOSIS — F332 Major depressive disorder, recurrent severe without psychotic features: Secondary | ICD-10-CM

## 2020-08-23 DIAGNOSIS — F411 Generalized anxiety disorder: Secondary | ICD-10-CM

## 2020-08-24 NOTE — Telephone Encounter (Signed)
Joellen, will you call her to see how she is doing on the topiramate medication for headache prevention? I sent her a MyChart message last week for which she has not read.

## 2020-08-31 NOTE — Telephone Encounter (Signed)
Called and left a voicemail for patient to return call to see how prescription is working for her. Awaiting call back prior to refill.

## 2020-09-06 ENCOUNTER — Ambulatory Visit (INDEPENDENT_AMBULATORY_CARE_PROVIDER_SITE_OTHER): Payer: 59 | Admitting: Psychology

## 2020-09-06 DIAGNOSIS — F411 Generalized anxiety disorder: Secondary | ICD-10-CM | POA: Diagnosis not present

## 2020-09-06 DIAGNOSIS — F332 Major depressive disorder, recurrent severe without psychotic features: Secondary | ICD-10-CM | POA: Diagnosis not present

## 2020-09-06 NOTE — Telephone Encounter (Signed)
Number not working have sent my chart message to f/u with patient.

## 2020-09-07 NOTE — Telephone Encounter (Signed)
Spoke with pt and she stated that it is better than the other medication that she was on, however, she is still having headaches 3/4x a week and she ask that we not refill it.

## 2020-10-11 ENCOUNTER — Ambulatory Visit: Payer: 59 | Admitting: Psychology

## 2021-02-01 ENCOUNTER — Encounter: Admit: 2021-02-01 | Discharge: 2021-02-02 | Payer: PRIVATE HEALTH INSURANCE

## 2021-02-01 MED ORDER — METRONIDAZOLE 500 MG TABLET
ORAL_TABLET | Freq: Three times a day (TID) | ORAL | 0 refills | 14.00000 days | Status: CP
Start: 2021-02-01 — End: 2021-02-15

## 2021-02-01 MED ORDER — ADALIMUMAB 80 MG/0.8 ML SUBCUTANEOUS PEN KIT
SUBCUTANEOUS | 0 refills | 28.00000 days | Status: CP
Start: 2021-02-01 — End: 2021-03-01

## 2021-02-01 MED ORDER — ADALIMUMAB PEN CITRATE FREE 40 MG/0.4 ML
SUBCUTANEOUS | 6 refills | 28.00000 days | Status: CP
Start: 2021-02-01 — End: ?

## 2021-02-02 DIAGNOSIS — K603 Anal fistula: Principal | ICD-10-CM

## 2021-02-02 DIAGNOSIS — L732 Hidradenitis suppurativa: Principal | ICD-10-CM

## 2021-02-02 MED ORDER — ADALIMUMAB 80 MG/0.8 ML SUBCUTANEOUS PEN KIT
SUBCUTANEOUS | 0 refills | 28.00000 days | Status: CP
Start: 2021-02-02 — End: 2021-03-02

## 2021-02-02 MED ORDER — ADALIMUMAB PEN CITRATE FREE 40 MG/0.4 ML
SUBCUTANEOUS | 6 refills | 28.00000 days | Status: CP
Start: 2021-02-02 — End: ?

## 2021-03-02 ENCOUNTER — Other Ambulatory Visit: Payer: Self-pay | Admitting: Primary Care

## 2021-03-02 DIAGNOSIS — F331 Major depressive disorder, recurrent, moderate: Secondary | ICD-10-CM

## 2021-03-17 ENCOUNTER — Ambulatory Visit: Admit: 2021-03-17 | Discharge: 2021-03-18 | Payer: PRIVATE HEALTH INSURANCE | Attending: Surgery | Primary: Surgery

## 2021-03-17 DIAGNOSIS — L732 Hidradenitis suppurativa: Principal | ICD-10-CM

## 2021-05-27 ENCOUNTER — Other Ambulatory Visit: Payer: Self-pay | Admitting: Primary Care

## 2021-05-27 DIAGNOSIS — F331 Major depressive disorder, recurrent, moderate: Secondary | ICD-10-CM

## 2021-06-22 ENCOUNTER — Other Ambulatory Visit: Payer: Self-pay | Admitting: Primary Care

## 2021-06-22 DIAGNOSIS — F331 Major depressive disorder, recurrent, moderate: Secondary | ICD-10-CM

## 2021-06-28 ENCOUNTER — Other Ambulatory Visit: Payer: Self-pay | Admitting: Primary Care

## 2021-06-28 DIAGNOSIS — F331 Major depressive disorder, recurrent, moderate: Secondary | ICD-10-CM

## 2021-10-27 ENCOUNTER — Ambulatory Visit (INDEPENDENT_AMBULATORY_CARE_PROVIDER_SITE_OTHER): Payer: BLUE CROSS/BLUE SHIELD | Admitting: Primary Care

## 2021-10-27 ENCOUNTER — Other Ambulatory Visit: Payer: Self-pay

## 2021-10-27 VITALS — BP 126/98 | HR 113 | Temp 97.7°F | Ht 64.0 in | Wt 214.2 lb

## 2021-10-27 DIAGNOSIS — L309 Dermatitis, unspecified: Secondary | ICD-10-CM | POA: Diagnosis not present

## 2021-10-27 DIAGNOSIS — R030 Elevated blood-pressure reading, without diagnosis of hypertension: Secondary | ICD-10-CM | POA: Diagnosis not present

## 2021-10-27 DIAGNOSIS — R519 Headache, unspecified: Secondary | ICD-10-CM

## 2021-10-27 DIAGNOSIS — E6609 Other obesity due to excess calories: Secondary | ICD-10-CM | POA: Diagnosis not present

## 2021-10-27 DIAGNOSIS — Z0000 Encounter for general adult medical examination without abnormal findings: Secondary | ICD-10-CM

## 2021-10-27 DIAGNOSIS — Z23 Encounter for immunization: Secondary | ICD-10-CM | POA: Diagnosis not present

## 2021-10-27 DIAGNOSIS — Z6836 Body mass index (BMI) 36.0-36.9, adult: Secondary | ICD-10-CM | POA: Diagnosis not present

## 2021-10-27 DIAGNOSIS — F411 Generalized anxiety disorder: Secondary | ICD-10-CM

## 2021-10-27 DIAGNOSIS — F331 Major depressive disorder, recurrent, moderate: Secondary | ICD-10-CM

## 2021-10-27 DIAGNOSIS — L732 Hidradenitis suppurativa: Secondary | ICD-10-CM | POA: Diagnosis not present

## 2021-10-27 DIAGNOSIS — L700 Acne vulgaris: Secondary | ICD-10-CM | POA: Diagnosis not present

## 2021-10-27 DIAGNOSIS — Z111 Encounter for screening for respiratory tuberculosis: Secondary | ICD-10-CM

## 2021-10-27 LAB — COMPREHENSIVE METABOLIC PANEL
ALT: 22 U/L (ref 0–35)
AST: 19 U/L (ref 0–37)
Albumin: 4.2 g/dL (ref 3.5–5.2)
Alkaline Phosphatase: 65 U/L (ref 39–117)
BUN: 7 mg/dL (ref 6–23)
CO2: 27 mEq/L (ref 19–32)
Calcium: 9.3 mg/dL (ref 8.4–10.5)
Chloride: 103 mEq/L (ref 96–112)
Creatinine, Ser: 0.72 mg/dL (ref 0.40–1.20)
GFR: 109.99 mL/min (ref >60.00)
Glucose, Bld: 118 mg/dL — ABNORMAL HIGH (ref 70–99)
Potassium: 4.3 mEq/L (ref 3.5–5.1)
Sodium: 137 mEq/L (ref 135–145)
Total Bilirubin: 0.4 mg/dL (ref 0.2–1.2)
Total Protein: 7.9 g/dL (ref 6.0–8.3)

## 2021-10-27 LAB — HEMOGLOBIN A1C: Hgb A1c MFr Bld: 5.2 % (ref 4.6–6.5)

## 2021-10-27 LAB — LIPID PANEL
Cholesterol: 225 mg/dL — ABNORMAL HIGH (ref 0–200)
HDL: 34.4 mg/dL — ABNORMAL LOW (ref >39.00)
NonHDL: 190.48
Total CHOL/HDL Ratio: 7
Triglycerides: 236 mg/dL — ABNORMAL HIGH (ref 0.0–149.0)
VLDL: 47.2 mg/dL — ABNORMAL HIGH (ref 0.0–40.0)

## 2021-10-27 LAB — LDL CHOLESTEROL, DIRECT: Direct LDL: 156 mg/dL

## 2021-10-27 LAB — CBC
HCT: 42.4 % (ref 36.0–46.0)
Hemoglobin: 14 g/dL (ref 12.0–15.0)
MCHC: 33.1 g/dL (ref 30.0–36.0)
MCV: 88.9 fl (ref 78.0–100.0)
Platelets: 248 10*3/uL (ref 150.0–400.0)
RBC: 4.77 Mil/uL (ref 3.87–5.11)
RDW: 13.4 % (ref 11.5–15.5)
WBC: 5.5 10*3/uL (ref 4.0–10.5)

## 2021-10-27 LAB — T4, FREE: Free T4: 0.74 ng/dL (ref 0.60–1.60)

## 2021-10-27 LAB — TSH: TSH: 0.35 u[IU]/mL (ref 0.35–5.50)

## 2021-10-27 MED ORDER — BUPROPION HCL ER (XL) 150 MG PO TB24: 150.0000 mg | ORAL_TABLET | Freq: Every day | ORAL | 3 refills | Status: AC

## 2021-10-27 MED ORDER — FLUOXETINE HCL 40 MG PO CAPS: 40.0000 mg | ORAL_CAPSULE | Freq: Every day | ORAL | 3 refills | Status: AC

## 2021-10-27 NOTE — Assessment & Plan Note (Signed)
Well controlled. No concerns today.   Continue fluoxetine 40 mg daily and bupropion XL 150 mg daily.

## 2021-10-27 NOTE — Assessment & Plan Note (Signed)
Influenza vaccine provided today. Tetanus vaccine UTD. Pap due, declines today despite recommendations.  Discussed the importance of a healthy diet and regular exercise in order for weight loss, and to reduce the risk of further co-morbidity.  Exam today stable. Labs pending.

## 2021-10-27 NOTE — Assessment & Plan Note (Signed)
No longer on treatment, hopes to resume soon with change of insurance.

## 2021-10-27 NOTE — Patient Instructions (Signed)
Stop by the lab prior to leaving today. I will notify you of your results once received.   You will be contacted regarding your referral to the dietician.  Please let us know if you have not been contacted within two weeks.   Monitor your blood pressure if possible. It should run less than 130 on top and less than 90 on bottom.   Schedule your pap smear when ready  It was a pleasure to see you today!   Preventive Care 63-34 Years Old, Female Preventive care refers to lifestyle choices and visits with your health care provider that can promote health and wellness. Preventive care visits are also called wellness exams. What can I expect for my preventive care visit? Counseling During your preventive care visit, your health care provider may ask about your: Medical history, including: Past medical problems. Family medical history. Pregnancy history. Current health, including: Menstrual cycle. Method of birth control. Emotional well-being. Home life and relationship well-being. Sexual activity and sexual health. Lifestyle, including: Alcohol, nicotine or tobacco, and drug use. Access to firearms. Diet, exercise, and sleep habits. Work and work Statistician. Sunscreen use. Safety issues such as seatbelt and bike helmet use. Physical exam Your health care provider may check your: Height and weight. These may be used to calculate your BMI (body mass index). BMI is a measurement that tells if you are at a healthy weight. Waist circumference. This measures the distance around your waistline. This measurement also tells if you are at a healthy weight and may help predict your risk of certain diseases, such as type 2 diabetes and high blood pressure. Heart rate and blood pressure. Body temperature. Skin for abnormal spots. What immunizations do I need? Vaccines are usually given at various ages, according to a schedule. Your health care provider will recommend vaccines for you based on  your age, medical history, and lifestyle or other factors, such as travel or where you work. What tests do I need? Screening Your health care provider may recommend screening tests for certain conditions. This may include: Pelvic exam and Pap test. Lipid and cholesterol levels. Diabetes screening. This is done by checking your blood sugar (glucose) after you have not eaten for a while (fasting). Hepatitis B test. Hepatitis C test. HIV (human immunodeficiency virus) test. STI (sexually transmitted infection) testing, if you are at risk. BRCA-related cancer screening. This may be done if you have a family history of breast, ovarian, tubal, or peritoneal cancers. Talk with your health care provider about your test results, treatment options, and if necessary, the need for more tests. Follow these instructions at home: Eating and drinking  Eat a healthy diet that includes fresh fruits and vegetables, whole grains, lean protein, and low-fat dairy products. Take vitamin and mineral supplements as recommended by your health care provider. Do not drink alcohol if: Your health care provider tells you not to drink. You are pregnant, may be pregnant, or are planning to become pregnant. If you drink alcohol: Limit how much you have to 0-1 drink a day. Know how much alcohol is in your drink. In the [place].S., one drink equals one 12 oz bottle of beer (355 mL), one 5 oz glass of wine (148 mL), or one 1 oz glass of hard liquor (44 mL). Lifestyle Brush your teeth every morning and night with fluoride toothpaste. Floss one time each day. Exercise for at least 30 minutes 5 or more days each week. Do not use any products that contain nicotine or tobacco.  These products include cigarettes, chewing tobacco, and vaping devices, such as e-cigarettes. If you need help quitting, ask your health care provider. Do not use drugs. If you are sexually active, practice safe sex. Use a condom or other form of protection to  prevent STIs. If you do not wish to become pregnant, use a form of birth control. If you plan to become pregnant, see your health care provider for a prepregnancy visit. Find healthy ways to manage stress, such as: Meditation, yoga, or listening to music. Journaling. Talking to a trusted person. Spending time with friends and family. Minimize exposure to UV radiation to reduce your risk of skin cancer. Safety Always wear your seat belt while driving or riding in a vehicle. Do not drive: If you have been drinking alcohol. Do not ride with someone who has been drinking. If you have been using any mind-altering substances or drugs. While texting. When you are tired or distracted. Wear a helmet and other protective equipment during sports activities. If you have firearms in your house, make sure you follow all gun safety procedures. Seek help if you have been physically or sexually abused. What's next? Go to your health care provider once a year for an annual wellness visit. Ask your health care provider how often you should have your eyes and teeth checked. Stay up to date on all vaccines. This information is not intended to replace advice given to you by your health care provider. Make sure you discuss any questions you have with your health care provider. Document Revised: 03/23/2021 Document Reviewed: 03/23/2021 Elsevier Patient Education  [place].

## 2021-10-27 NOTE — Assessment & Plan Note (Signed)
Elevated BP today.  Checking labs. Referral placed for dietician.  Discussed the importance of a healthy diet and regular exercise in order for weight loss, and to reduce the risk of further co-morbidity.

## 2021-10-27 NOTE — Assessment & Plan Note (Signed)
Occasional flares. Continue triamcinolone 0.1% ointment.

## 2021-10-27 NOTE — Assessment & Plan Note (Signed)
Following with dermatology. Continue spironolactone 50 mg BID.

## 2021-10-27 NOTE — Assessment & Plan Note (Signed)
Well controlled. °No concerns today.  ° °Continue fluoxetine 40 mg daily and bupropion XL 150 mg daily.  °

## 2021-10-27 NOTE — Assessment & Plan Note (Signed)
Noted today, even on recheck.  Strongly advised weight loss. Also encouraged to start checking BP at home.   Referral placed for dietician.

## 2021-10-27 NOTE — Progress Notes (Signed)
Subjective:    Patient ID: Tracie [name], female    DOB: 1986-08-11, 36 y.o.   MRN: [id number]  HPI  Tracie [name] is a very pleasant 36 y.o. female who presents today for complete physical and follow up of chronic conditions.  She is needing a TB test done for a new job. Has a form today.   Immunizations: -Tetanus: 2016 -Influenza: Due today -Covid-19: 2 vaccines  Diet: Fair diet. Concerned with weight gain over the years.  Exercise: No regular exercise.  Eye exam: Completes every 2 years.   Dental exam: Completes semi-annually   Pap Smear: Completed in 2019, declines today.   BP Readings from Last 3 Encounters:  10/27/21 (!) 126/98  06/29/20 110/62  06/22/20 110/62      Review of Systems  Constitutional:  Negative for unexpected weight change.  HENT:  Negative for rhinorrhea.   Eyes:  Negative for visual disturbance.  Respiratory:  Negative for cough and shortness of breath.   Cardiovascular:  Negative for chest pain.  Gastrointestinal:  Negative for constipation and diarrhea.  Genitourinary:  Negative for difficulty urinating.  Musculoskeletal:  Negative for arthralgias and myalgias.  Skin:  Negative for rash.  Allergic/Immunologic: Negative for environmental allergies.  Neurological:  Positive for headaches. Negative for dizziness.  Psychiatric/Behavioral:  The patient is not nervous/anxious.         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   2 children.    Works as a Agricultural engineer.   Has Cosmetology License.   Enjoys painting, spending time with family.   Social Determinants of Health   Financial Resource Strain: Not on file  Food Insecurity: Not on file  Transportation Needs: Not on file  Physical Activity: Not on file  Stress: Not on file  Social Connections: Not on file  Intimate Partner Violence: Not on file    Past Surgical History:  Procedure Laterality Date   DIAGNOSTIC LAPAROSCOPY  DILATION AND CURETTAGE OF UTERUS     TONSILLECTOMY     TONSILLECTOMY AND ADENOIDECTOMY  1994   VULVA [place] BIOPSY Left 01/31/2016   Procedure: VULVAR BIOPSY;  Surgeon: Boykin Nearing, MD;  Location: [facility] ORS;  Service: Gynecology;  Laterality: Left;    Family History  Problem Relation Age of Onset   Breast cancer Maternal Grandmother    Skin cancer Maternal Grandmother    Lung cancer Maternal Grandfather    Prostate cancer Paternal Grandfather     Allergies  Allergen Reactions   Cephalexin Swelling   Hydrocodone-Acetaminophen Nausea Only   Hydrocodone-Acetaminophen Nausea Only    Current Outpatient  Medications on File Prior to Visit  Medication Sig Dispense Refill   buPROPion (WELLBUTRIN XL) 150 MG 24 hr tablet TAKE 1 TABLET (150 MG TOTAL) BY MOUTH DAILY. FOR DEPRESSION 30 tablet 1   cetirizine (ZYRTEC) 10 MG tablet Take 10 mg by mouth.     FLUoxetine (PROZAC) 40 MG capsule TAKE 1 CAPSULE (40 MG TOTAL) BY MOUTH DAILY. FOR ANXIETY AND DEPRESSION 30 capsule 1   spironolactone (ALDACTONE) 50 MG tablet Take 50 mg by mouth 2 (two) times daily.     triamcinolone ointment (KENALOG) 0.1 % Apply topically.     No current facility-administered medications on file prior to visit.    BP (!) 126/98    Pulse (!) 113    Temp 97.7 F (36.5 C) (Temporal)    Ht 5\' 4"  (1.626 m)    Wt 214 lb 3 oz (97.2 kg)    SpO2 98%    BMI 36.77 kg/m  Objective:   Physical Exam HENT:     Right Ear: Tympanic membrane and ear canal normal.     Left Ear: Tympanic membrane and ear canal normal.     Nose: Nose normal.  Eyes:     Conjunctiva/sclera: Conjunctivae normal.     Pupils: Pupils are equal, round, and reactive to light.  Neck:     Thyroid: No thyromegaly.  Cardiovascular:     Rate and Rhythm: Normal rate and regular rhythm.     Heart sounds: No murmur heard. Pulmonary:     Effort: Pulmonary effort is normal.     Breath sounds: Normal breath sounds. No rales.  Abdominal:     General: Bowel sounds are normal.     Palpations: Abdomen is soft.     Tenderness: There is no abdominal tenderness.  Musculoskeletal:        General: Normal range of motion.     Cervical back: Neck supple.  Lymphadenopathy:     Cervical: No cervical adenopathy.  Skin:    General: Skin is warm and dry.     Findings: No rash.  Neurological:     Mental Status: She is alert and oriented to person, place, and time.     Cranial Nerves: No cranial nerve deficit.     Deep Tendon Reflexes: Reflexes are normal and symmetric.  Psychiatric:        Mood and Affect: Mood normal.          Assessment & Plan:      This visit  occurred during the SARS-CoV-2 public health emergency.  Safety protocols were in place, including screening questions prior to the visit, additional usage of staff PPE, and extensive cleaning of exam room while observing appropriate contact time as indicated for disinfecting solutions.

## 2021-10-27 NOTE — Assessment & Plan Note (Addendum)
Chronic, continued, overall improved.  Topamax 50 mg and propranolol 80 mg ineffective. She declines treatment for now but will update if she changes her mind.  Consider titration to increased dose of either medication.

## 2021-10-31 LAB — QUANTIFERON-TB GOLD PLUS

## 2021-11-01 ENCOUNTER — Other Ambulatory Visit: Payer: Self-pay

## 2021-11-01 ENCOUNTER — Ambulatory Visit (INDEPENDENT_AMBULATORY_CARE_PROVIDER_SITE_OTHER): Payer: BLUE CROSS/BLUE SHIELD

## 2021-11-01 DIAGNOSIS — Z111 Encounter for screening for respiratory tuberculosis: Secondary | ICD-10-CM | POA: Diagnosis not present

## 2021-11-01 NOTE — Telephone Encounter (Signed)
Pt had PPD placed today at 2:50pm. Pt would like to have her PPD read on Thursday a little after 2:50pm at [place] so her paperwork can be signed by PCP and she can start work on Friday, pending a negative reading.  Routing to PCP and CMA as an FYI.

## 2021-11-01 NOTE — Progress Notes (Signed)
PPD Placement note Tracie [name], 36 y.o. female is here today for placement of PPD test Reason for PPD test: work Pt taken PPD test before: no Verified in allergy area and with patient that they are not allergic to the products PPD is made of (Phenol or Tween). No:  Is patient taking any oral or IV steroid medication now or have they taken it in the last month? no Has the patient ever received the BCG vaccine?: no Has the patient been in recent contact with anyone known or suspected of having active TB disease?: no      Date of exposure (if applicable): n/a      Name of person they were exposed to (if applicable): n/a Patient's Country of origin?: [place] O: Alert and oriented in NAD. P:  PPD placed on 11/01/2021.  Patient advised to return for reading within 48-72 hours.

## 2021-11-01 NOTE — Telephone Encounter (Signed)
Noted  

## 2021-11-03 LAB — TB SKIN TEST
Induration: 0 mm
TB Skin Test: NEGATIVE

## 2021-11-03 NOTE — Addendum Note (Signed)
Addended by: Nyra Capes on: 11/03/2021 04:26 PM   Modules accepted: Orders

## 2021-11-04 NOTE — Telephone Encounter (Signed)
Patent came by office after having ppd read in [place] and picked up paperwork yesterday. Copy sent to scan

## 2021-12-21 ENCOUNTER — Encounter: Payer: Self-pay | Admitting: Family

## 2021-12-21 ENCOUNTER — Ambulatory Visit (INDEPENDENT_AMBULATORY_CARE_PROVIDER_SITE_OTHER): Payer: BLUE CROSS/BLUE SHIELD | Admitting: Family

## 2021-12-21 ENCOUNTER — Other Ambulatory Visit: Payer: Self-pay

## 2021-12-21 VITALS — BP 116/88 | HR 102 | Temp 98.7°F | Ht 64.0 in | Wt 203.0 lb

## 2021-12-21 DIAGNOSIS — R5382 Chronic fatigue, unspecified: Secondary | ICD-10-CM | POA: Diagnosis not present

## 2021-12-21 DIAGNOSIS — R21 Rash and other nonspecific skin eruption: Secondary | ICD-10-CM | POA: Diagnosis not present

## 2021-12-21 DIAGNOSIS — M6281 Muscle weakness (generalized): Secondary | ICD-10-CM | POA: Diagnosis not present

## 2021-12-21 DIAGNOSIS — R252 Cramp and spasm: Secondary | ICD-10-CM

## 2021-12-21 DIAGNOSIS — L03012 Cellulitis of left finger: Secondary | ICD-10-CM

## 2021-12-21 DIAGNOSIS — M2550 Pain in unspecified joint: Secondary | ICD-10-CM

## 2021-12-21 LAB — BASIC METABOLIC PANEL
BUN: 9 mg/dL (ref 6–23)
CO2: 25 mEq/L (ref 19–32)
Calcium: 9.4 mg/dL (ref 8.4–10.5)
Chloride: 101 mEq/L (ref 96–112)
Creatinine, Ser: 0.71 mg/dL (ref 0.40–1.20)
GFR: 111.74 mL/min (ref >60.00)
Glucose, Bld: 110 mg/dL — ABNORMAL HIGH (ref 70–99)
Potassium: 4.1 mEq/L (ref 3.5–5.1)
Sodium: 137 mEq/L (ref 135–145)

## 2021-12-21 LAB — CBC WITH DIFFERENTIAL/PLATELET
Basophils Absolute: 0.1 10*3/uL (ref 0.0–0.1)
Basophils Relative: 0.7 % (ref 0.0–3.0)
Eosinophils Absolute: 0.2 10*3/uL (ref 0.0–0.7)
Eosinophils Relative: 2.8 % (ref 0.0–5.0)
HCT: 42 % (ref 36.0–46.0)
Hemoglobin: 14.2 g/dL (ref 12.0–15.0)
Lymphocytes Relative: 11.6 % — ABNORMAL LOW (ref 12.0–46.0)
Lymphs Abs: 0.9 10*3/uL (ref 0.7–4.0)
MCHC: 33.8 g/dL (ref 30.0–36.0)
MCV: 89.1 fl (ref 78.0–100.0)
Monocytes Absolute: 0.6 10*3/uL (ref 0.1–1.0)
Monocytes Relative: 8 % (ref 3.0–12.0)
Neutro Abs: 6 10*3/uL (ref 1.4–7.7)
Neutrophils Relative %: 76.9 % (ref 43.0–77.0)
Platelets: 246 10*3/uL (ref 150.0–400.0)
RBC: 4.71 Mil/uL (ref 3.87–5.11)
RDW: 13.1 % (ref 11.5–15.5)
WBC: 7.8 10*3/uL (ref 4.0–10.5)

## 2021-12-21 LAB — SEDIMENTATION RATE: Sed Rate: 63 mm/hr — ABNORMAL HIGH (ref 0–20)

## 2021-12-21 LAB — C-REACTIVE PROTEIN: CRP: 10.8 mg/dL (ref 0.5–20.0)

## 2021-12-21 LAB — B12 AND FOLATE PANEL
Folate: 11.6 ng/mL (ref >5.9)
Vitamin B-12: 418 pg/mL (ref 211–911)

## 2021-12-21 LAB — TSH: TSH: 0.4 u[IU]/mL (ref 0.35–5.50)

## 2021-12-21 LAB — T3, FREE: T3, Free: 3.8 pg/mL (ref 2.3–4.2)

## 2021-12-21 LAB — T4, FREE: Free T4: 0.88 ng/dL (ref 0.60–1.60)

## 2021-12-21 LAB — CK: Total CK: 36 U/L (ref 7–177)

## 2021-12-21 MED ORDER — DOXYCYCLINE HYCLATE 100 MG PO TABS: 100.0000 mg | ORAL_TABLET | Freq: Two times a day (BID) | ORAL | 0 refills | Status: AC

## 2021-12-21 MED ORDER — MUPIROCIN 2 % EX OINT: 1.0000 "application " | TOPICAL_OINTMENT | Freq: Two times a day (BID) | CUTANEOUS | 0 refills | Status: AC

## 2021-12-21 NOTE — Assessment & Plan Note (Signed)
Doxycycline 100 mg po bid x 10 days sent to pharmacy  ?Monitor for s/s worsening infection, if no improvement f/u with derm (already established) ?

## 2021-12-21 NOTE — Progress Notes (Signed)
Established Patient Office Visit  Subjective:  Patient ID: Tracie [name], female    DOB: Dec 30, 1985  Age: 36 y.o. MRN: [id number]  CC:  Chief Complaint  Patient presents with   Hand Pain    Left finger medial side --redness, drainage, sore--1 month    HPI Tracie [name] is here today with concerns.   Noticed about one month ago that she was starting to possibly get an ingrown side nail, then it drained and went away but came back in the last few weeks. She had been soaking it a lot in hot water. She is noticing that she has yellow green pus right lower nailbed with some swelling and erythema, tender to the touch. This is her ring finger on the left side.  Also with concerns of chronic fatigue, tired all of the time. Harder to work on a daily basis and she states she stays in bed more often than not. She has muscle weakness at times where it's hard for her to stand because she has dizziness or feels like knees will collapse. Feels hot all of the time. Does see a rash on her face but also with eczema, flare ups and redness on her bil cheeks as well as some on bridge of nose/forehead. Does have joint pains, bil hips, bil knees. Feels like she has to hunch over to walk because it hurts.   Her concern is well is that she feels she has a constant sickness, every three weeks or so with a cold. Also with thinning hair loss. Also concerned with weight gain however started to really focus on weight loss and has been able to lose 14 pounds.   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 G3P1011 at [redacted]w[redacted]d by  LMP of  11/05/14  Factors complicating this pregnancy   Obesity  Screening results and needs:  NOB:   MBT  A+  Ab screen  Neg Pap  HIV  Hep B/RPR negative/negative  Rubella immune   VZV   Aneuploidy:   First trimester (Informaseq, NT):   Second trimester (AFP/tetra):   28 weeks:   Blood consent:  Hgb:  11.7  Glucola: 143, 3 hr GTT was normal (85,143   Vulval hidradenitis suppurativa 09/08/2015   Vulvar abscess 12/29/2015    Past Surgical History:  Procedure Laterality Date   DIAGNOSTIC LAPAROSCOPY     DILATION AND CURETTAGE OF UTERUS     TONSILLECTOMY     TONSILLECTOMY AND ADENOIDECTOMY  1994   VULVA /PERINEUM BIOPSY Left 01/31/2016   Procedure: VULVAR BIOPSY;  Surgeon: Suzy Bouchard, MD;  Location: [facility];  Service: Gynecology;  Laterality: Left;    Family History  Problem Relation Age of Onset   Healthy Mother    Dementia Father    Breast cancer Maternal Grandmother    Skin cancer Maternal Grandmother    Lung cancer Maternal Grandfather    Prostate cancer Paternal Grandfather     Social History   Socioeconomic History   Marital status: Married    Spouse name: Not on file   Number of children: Not on  file   Years of education: Not on file   Highest education level: Not on file  Occupational History   Not on file  Tobacco Use   Smoking status: Never   Smokeless tobacco: Never  Substance and Sexual Activity   Alcohol use: Yes   Drug use: No   Sexual activity: Not on file  Other Topics Concern   Not on file  Social History Narrative   Married.   2 children.    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   Enjoys painting, spending time with family.   Social Determinants of Health   Financial Resource Strain: Not on file  Food Insecurity: Not on file  Transportation Needs: Not on file  Physical Activity: Not on file  Stress: Not on file  Social Connections: Not on file  Intimate Partner Violence: Not on file    Outpatient Medications Prior to  Visit  Medication Sig Dispense Refill   buPROPion (WELLBUTRIN XL) 150 MG 24 hr tablet Take 1 tablet (150 mg total) by mouth daily. For depression 90 tablet 3   cetirizine (ZYRTEC) 10 MG tablet Take 10 mg by mouth.     FLUoxetine (PROZAC) 40 MG capsule Take 1 capsule (40 mg total) by mouth daily. For anxiety and depression 90 capsule 3   spironolactone (ALDACTONE) 50 MG tablet Take 50 mg by mouth 2 (two) times daily.     triamcinolone ointment (KENALOG) 0.1 % Apply topically.     No facility-administered medications prior to visit.    Allergies  Allergen Reactions   Cephalexin Swelling   Hydrocodone-Acetaminophen Nausea Only    ROS Review of Systems  Constitutional:  Positive for fatigue and unexpected weight change (weight gain). Negative for chills.  Respiratory:  Negative for cough and shortness of breath.   Cardiovascular:  Negative for chest pain and leg swelling.  Gastrointestinal:  Negative for diarrhea and nausea.  Genitourinary:  Negative for difficulty urinating.  Skin:  Positive for rash (with yellow discharge around left ring finger with redness and tenderness).       Also red erythema rash on bil cheeks, forehead. Also papular rash on chest and back of neck appearing hivelike  Neurological:  Positive for dizziness, weakness and light-headedness.  Psychiatric/Behavioral:  Negative for agitation and sleep disturbance.   All other systems reviewed and are negative.    Objective:    Physical Exam Vitals reviewed.  Constitutional:      General: She is not in acute distress.    Appearance: Normal appearance. She is obese. She is not ill-appearing, toxic-appearing or diaphoretic.  HENT:     Right Ear: Tympanic membrane normal.     Left Ear: Tympanic membrane normal.     Mouth/Throat:     Mouth: Mucous membranes are moist.     Pharynx: No pharyngeal swelling.     Tonsils: No tonsillar exudate.  Eyes:     Extraocular Movements: Extraocular movements intact.      Conjunctiva/sclera: Conjunctivae normal.     Pupils: Pupils are equal, round, and reactive to light.  Neck:     Thyroid: No thyroid mass.  Cardiovascular:     Rate and Rhythm: Normal rate and regular rhythm.  Pulmonary:     Effort: Pulmonary effort is normal.     Breath sounds: Normal breath sounds.  Abdominal:     General: Abdomen is flat. Bowel sounds are normal.     Palpations: Abdomen is soft.  Musculoskeletal:  General: Normal range of motion.  Lymphadenopathy:     Cervical:     Right cervical: No superficial cervical adenopathy.    Left cervical: No superficial cervical adenopathy.  Skin:    General: Skin is warm.     Capillary Refill: Capillary refill takes less than 2 seconds.     Findings: Rash (erythema redness and tenderness on right inner medial nail bed of left fourth metacarpal with yellow dried discharge) present.     Comments: Red raised erythema rash noted on bil cheeks and mid forehead, also with papular lesions on check to mid trunk. Urticaric wheels on posterior lower neck. No rash on scalp visualized.  Neurological:     General: No focal deficit present.     Mental Status: She is alert and oriented to person, place, and time.     Cranial Nerves: No cranial nerve deficit.     Motor: No weakness.     Gait: Gait normal.  Psychiatric:        Mood and Affect: Mood normal.        Behavior: Behavior normal.        Thought Content: Thought content normal.        Judgment: Judgment normal.     BP 116/88   Pulse (!) 102   Temp 98.7 F (37.1 C)   Ht 5\' 4"  (1.626 m)   Wt 203 lb (92.1 kg)   SpO2 99%   BMI 34.84 kg/m  Wt Readings from Last 3 Encounters:  12/21/21 203 lb (92.1 kg)  10/27/21 214 lb 3 oz (97.2 kg)  06/29/20 203 lb (92.1 kg)     Health Maintenance Due  Topic Date Due   COVID-19 Vaccine (3 - Pfizer risk series) 03/01/2020   PAP SMEAR-Modifier  03/13/2021    There are no preventive care reminders to display for this patient.  Lab  Results  Component Value Date   TSH 0.35 10/27/2021   Lab Results  Component Value Date   WBC 5.5 10/27/2021   HGB 14.0 10/27/2021   HCT 42.4 10/27/2021   MCV 88.9 10/27/2021   PLT 248.0 10/27/2021   Lab Results  Component Value Date   NA 137 10/27/2021   K 4.3 10/27/2021   CO2 27 10/27/2021   GLUCOSE 118 (H) 10/27/2021   BUN 7 10/27/2021   CREATININE 0.72 10/27/2021   BILITOT 0.4 10/27/2021   ALKPHOS 65 10/27/2021   AST 19 10/27/2021   ALT 22 10/27/2021   PROT 7.9 10/27/2021   ALBUMIN 4.2 10/27/2021   CALCIUM 9.3 10/27/2021   ANIONGAP 9 02/19/2020   GFR 109.99 10/27/2021   Lab Results  Component Value Date   HGBA1C 5.2 10/27/2021      Assessment & Plan:   Problem List Items Addressed This Visit       Musculoskeletal and Integument   Paronychia of left ring finger - Primary    Doxycycline 100 mg po bid x 10 days sent to pharmacy  Monitor for s/s worsening infection, if no improvement f/u with derm (already established)      Relevant Medications   doxycycline (VIBRA-TABS) 100 MG tablet   mupirocin ointment (BACTROBAN) 2 %   Rash of face    Eczema vs rosacea vs butterfly rash Ana crp sed rate ordered pending results Also cbc pending        Other   Muscle weakness    Ck bmp ordered pending results       Relevant Orders  CK   C-reactive protein   Sedimentation rate   Rheumatoid factor   ANA   Muscle cramps    Ck ordered Work on water hydration throughout day       Relevant Orders   CK   C-reactive protein   Sedimentation rate   Rheumatoid factor   ANA   Polyarthralgia    Ana crp sed rate rf ordered pending results  Extensive workup in office to include reviewing notes, old labs, and going over acute and chronic concerns. Physical exam as well to take approximately 52 minutes       Relevant Orders   C-reactive protein   Sedimentation rate   Rheumatoid factor   ANA   Chronic fatigue    Lab workup to include tsh, b12 , cbc, cmp       Relevant Orders   Thyroid Peroxidase Antibodies (TPO) (REFL)   T3, free   T4, free   TSH   B12 and Folate Panel   CBC with Differential   Basic metabolic panel   C-reactive protein   Sedimentation rate   Rheumatoid factor   ANA    Meds ordered this encounter  Medications   doxycycline (VIBRA-TABS) 100 MG tablet    Sig: Take 1 tablet (100 mg total) by mouth 2 (two) times daily for 10 days.    Dispense:  20 tablet    Refill:  0    Order Specific Question:   Supervising Provider    Answer:   BEDSOLE, AMY E [[id number]]   mupirocin ointment (BACTROBAN) 2 %    Sig: Apply 1 application. topically 2 (two) times daily for 14 days.    Dispense:  28 g    Refill:  0    Order Specific Question:   Supervising Provider    Answer:   BEDSOLE, AMY E [[id number]]    Follow-up: Return if symptoms worsen or fail to improve.    Mort Sawyers, FNP

## 2021-12-21 NOTE — Assessment & Plan Note (Signed)
Lab workup to include tsh, b12 , cbc, cmp ?

## 2021-12-21 NOTE — Assessment & Plan Note (Addendum)
Tracie [name] crp sed rate rf ordered pending results ? ?Extensive workup in office to include reviewing notes, old labs, and going over acute and chronic concerns. Physical exam as well to take approximately 52 minutes  ?

## 2021-12-21 NOTE — Assessment & Plan Note (Signed)
Eczema vs rosacea vs butterfly rash ?Ana crp sed rate ordered pending results ?Also cbc pending ?

## 2021-12-21 NOTE — Assessment & Plan Note (Signed)
Ck ordered ?Work on water hydration throughout day ? ?

## 2021-12-21 NOTE — Patient Instructions (Addendum)
Stop by the lab prior to leaving today. I will notify you of your results once received.  ? ?It was a pleasure seeing you today! Please do not hesitate to reach out with any questions and or concerns. ? ?Regards,  ? ?Charnita Trudel ?FNP-C ? ? ?

## 2021-12-21 NOTE — Assessment & Plan Note (Signed)
Ck bmp ordered pending results ? ?

## 2021-12-23 LAB — ANA: Anti Nuclear Antibody (ANA): NEGATIVE

## 2021-12-23 LAB — THYROID PEROXIDASE ANTIBODIES (TPO) (REFL): Thyroperoxidase Ab SerPl-aCnc: 1 IU/mL (ref <9)

## 2021-12-23 LAB — RHEUMATOID FACTOR: Rheumatoid fact SerPl-aCnc: <14 IU/mL (ref <14)
